# Patient Record
Sex: Male | Born: 1965 | Race: White | Hispanic: No | Marital: Single | Smoking: Current every day smoker
Health system: Southern US, Community
[De-identification: ages and names within clinical notes are randomized; demographics above are authoritative.]

## PROBLEM LIST (undated history)

## (undated) DIAGNOSIS — R0602 Shortness of breath: Secondary | ICD-10-CM

## (undated) DIAGNOSIS — I10 Essential (primary) hypertension: Secondary | ICD-10-CM

## (undated) DIAGNOSIS — F99 Mental disorder, not otherwise specified: Secondary | ICD-10-CM

---

## 2008-09-15 ENCOUNTER — Emergency Department (HOSPITAL_COMMUNITY): Admission: EM | Admit: 2008-09-15 | Discharge: 2008-09-15 | Payer: Self-pay | Admitting: Emergency Medicine

## 2012-01-14 ENCOUNTER — Emergency Department (HOSPITAL_COMMUNITY)
Admission: EM | Admit: 2012-01-14 | Discharge: 2012-01-19 | Disposition: A | Discharge status: Other Acute Inpatient Hospital | Payer: Self-pay | Attending: Emergency Medicine | Admitting: Emergency Medicine

## 2012-01-14 ENCOUNTER — Encounter (HOSPITAL_COMMUNITY): Payer: Self-pay | Admitting: Emergency Medicine

## 2012-01-14 DIAGNOSIS — Z9119 Patient's noncompliance with other medical treatment and regimen: Secondary | ICD-10-CM | POA: Insufficient documentation

## 2012-01-14 DIAGNOSIS — F329 Major depressive disorder, single episode, unspecified: Secondary | ICD-10-CM | POA: Insufficient documentation

## 2012-01-14 DIAGNOSIS — F29 Unspecified psychosis not due to a substance or known physiological condition: Secondary | ICD-10-CM | POA: Insufficient documentation

## 2012-01-14 DIAGNOSIS — I10 Essential (primary) hypertension: Secondary | ICD-10-CM | POA: Insufficient documentation

## 2012-01-14 DIAGNOSIS — Z8659 Personal history of other mental and behavioral disorders: Secondary | ICD-10-CM | POA: Insufficient documentation

## 2012-01-14 DIAGNOSIS — F3289 Other specified depressive episodes: Secondary | ICD-10-CM | POA: Insufficient documentation

## 2012-01-14 DIAGNOSIS — F17200 Nicotine dependence, unspecified, uncomplicated: Secondary | ICD-10-CM | POA: Insufficient documentation

## 2012-01-14 DIAGNOSIS — Z91199 Patient's noncompliance with other medical treatment and regimen due to unspecified reason: Secondary | ICD-10-CM | POA: Insufficient documentation

## 2012-01-14 LAB — CBC
HCT: 50.8 % (ref 39.0–52.0)
MCH: 31.3 pg (ref 26.0–34.0)
MCHC: 35 g/dL (ref 30.0–36.0)
MCV: 89.3 fL (ref 78.0–100.0)
Platelets: 332 10*3/uL (ref 150–400)
RDW: 12.6 % (ref 11.5–15.5)

## 2012-01-14 LAB — COMPREHENSIVE METABOLIC PANEL
Albumin: 4.2 g/dL (ref 3.5–5.2)
BUN: 7 mg/dL (ref 6–23)
Calcium: 10 mg/dL (ref 8.4–10.5)
Chloride: 100 mEq/L (ref 96–112)
Creatinine, Ser: 1 mg/dL (ref 0.50–1.35)
Total Bilirubin: 0.6 mg/dL (ref 0.3–1.2)
Total Protein: 8.2 g/dL (ref 6.0–8.3)

## 2012-01-14 LAB — ETHANOL: Alcohol, Ethyl (B): <11 mg/dL (ref 0–11)

## 2012-01-14 LAB — RAPID URINE DRUG SCREEN, HOSP PERFORMED
Amphetamines: NOT DETECTED
Cocaine: NOT DETECTED
Opiates: NOT DETECTED

## 2012-01-14 MED ORDER — HALOPERIDOL LACTATE 5 MG/ML IJ SOLN: 5.0000 mg | Freq: Four times a day (QID) | INTRAMUSCULAR | Status: DC | PRN

## 2012-01-14 MED ORDER — AMLODIPINE BESYLATE 10 MG PO TABS
10.0000 mg | ORAL_TABLET | Freq: Once | ORAL | Status: AC
  Administered 2012-01-14: 10 mg via ORAL
  Filled 2012-01-14 (×2): qty 1

## 2012-01-14 MED ORDER — BENZTROPINE MESYLATE 1 MG/ML IJ SOLN: 2.0000 mg | Freq: Two times a day (BID) | INTRAMUSCULAR | Status: DC | PRN

## 2012-01-14 MED ORDER — ACETAMINOPHEN 325 MG PO TABS: 650.0000 mg | ORAL_TABLET | ORAL | Status: DC | PRN

## 2012-01-14 MED ORDER — DIVALPROEX SODIUM 500 MG PO DR TAB
500.0000 mg | DELAYED_RELEASE_TABLET | Freq: Two times a day (BID) | ORAL | Status: DC
  Administered 2012-01-14 – 2012-01-18 (×9): 500 mg via ORAL
  Filled 2012-01-14 (×9): qty 1

## 2012-01-14 MED ORDER — SODIUM CHLORIDE 0.9 % IV BOLUS (SEPSIS)
1000.0000 mL | Freq: Once | INTRAVENOUS | Status: AC
  Administered 2012-01-14: 1000 mL via INTRAVENOUS

## 2012-01-14 MED ORDER — HALOPERIDOL 5 MG PO TABS
5.0000 mg | ORAL_TABLET | Freq: Two times a day (BID) | ORAL | Status: DC
  Administered 2012-01-14 – 2012-01-18 (×9): 5 mg via ORAL
  Filled 2012-01-14 (×9): qty 1

## 2012-01-14 MED ORDER — CLONAZEPAM 1 MG PO TABS
2.0000 mg | ORAL_TABLET | Freq: Two times a day (BID) | ORAL | Status: DC
  Administered 2012-01-14 – 2012-01-18 (×9): 2 mg via ORAL
  Filled 2012-01-14 (×2): qty 4
  Filled 2012-01-14 (×7): qty 2

## 2012-01-14 MED ORDER — LORAZEPAM 2 MG/ML IJ SOLN: 2.0000 mg | Freq: Four times a day (QID) | INTRAMUSCULAR | Status: DC | PRN

## 2012-01-14 MED ORDER — ONDANSETRON HCL 4 MG PO TABS: 4.0000 mg | ORAL_TABLET | Freq: Three times a day (TID) | ORAL | Status: DC | PRN

## 2012-01-14 MED ORDER — ALUM & MAG HYDROXIDE-SIMETH 200-200-20 MG/5ML PO SUSP: 30.0000 mL | ORAL | Status: DC | PRN

## 2012-01-14 MED ORDER — IRBESARTAN 150 MG PO TABS
150.0000 mg | ORAL_TABLET | Freq: Every day | ORAL | Status: DC
  Administered 2012-01-15 – 2012-01-18 (×4): 150 mg via ORAL
  Filled 2012-01-14 (×5): qty 1

## 2012-01-14 MED ORDER — ZOLPIDEM TARTRATE 5 MG PO TABS
5.0000 mg | ORAL_TABLET | Freq: Every evening | ORAL | Status: DC | PRN
  Filled 2012-01-14: qty 1

## 2012-01-14 MED ORDER — IBUPROFEN 600 MG PO TABS
600.0000 mg | ORAL_TABLET | Freq: Three times a day (TID) | ORAL | Status: DC | PRN
  Administered 2012-01-18: 600 mg via ORAL
  Filled 2012-01-14: qty 1

## 2012-01-14 MED ORDER — LORAZEPAM 1 MG PO TABS
1.0000 mg | ORAL_TABLET | Freq: Three times a day (TID) | ORAL | Status: DC | PRN
  Administered 2012-01-14 (×2): 1 mg via ORAL
  Filled 2012-01-14 (×2): qty 1

## 2012-01-14 MED ORDER — DIPHENHYDRAMINE HCL 50 MG/ML IJ SOLN: 50.0000 mg | Freq: Every evening | INTRAMUSCULAR | Status: DC | PRN

## 2012-01-14 MED ORDER — AMLODIPINE BESYLATE 5 MG PO TABS: 5.0000 mg | ORAL_TABLET | Freq: Every day | ORAL | Status: DC

## 2012-01-14 MED ORDER — CLONIDINE HCL 0.1 MG PO TABS
0.2000 mg | ORAL_TABLET | Freq: Once | ORAL | Status: AC
  Administered 2012-01-14: 0.1 mg via ORAL
  Filled 2012-01-14: qty 2

## 2012-01-14 NOTE — ED Notes (Signed)
Pt alert, arrives from home, presents via [employer], pt c/o depression, states has never been sen in [facility] for the same, pt has poor eye contact, staring into distance during conversation, pt appears frustrated to RN triage questioning, pt denies SI, per registration pt was arguing with significant other in [place]
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
## 2012-01-14 NOTE — ED Notes (Signed)
Patient took one pill out of the cup and stated that he will decide in 30 mins if he will take the other. Explained to patient that I could not hold the medication. Pt closed his eyes and rolled over in the bed.

## 2012-01-14 NOTE — ED Notes (Signed)
Pt's sister called with contact info given (H) [phone], (C) [phone].

## 2012-01-14 NOTE — ED Notes (Addendum)
Pt is getting dressed, Clinical research associate gave pt two gowns, no blue scrubs, and pt is bariatric

## 2012-01-14 NOTE — ED Notes (Signed)
Aldona Lento: Sister Home: [phone] Cell  :  [phone] Password: Lawrence Santiago

## 2012-01-14 NOTE — ED Notes (Signed)
Discussed elevated BP with Kathrine Cords. NP.   Patient can move to psych ED when BP lowers to acceptable range

## 2012-01-14 NOTE — ED Notes (Signed)
Pt getting oob stating he needs to use the bathroom. Staff attempted to redirect him to bathroom however he returns to bed and exposes himself stating that he is "trying to prove a point". Refusing to cover himself and seems to be getting more irritated. Consulting civil engineer and Security came to room to speak with pt. As soon as security is present, pt lowers gown and becomes quiet again. Will cont to monitor with sitter at bedside Campos-Garcia, Tresa Endo

## 2012-01-14 NOTE — ED Notes (Addendum)
Pt transferred to TCU with chart & 1 bag of personal belongings accompanied by Susa Griffins, NT, condition stable at time of transfer.

## 2012-01-14 NOTE — ED Notes (Addendum)
Pt got out of bed and slammed door, Security and [employer] present, will monitor. Pt continues to remove O2 sat, BP cuff and cardiac monitor.

## 2012-01-14 NOTE — ED Provider Notes (Signed)
History     CSN: [id number]  Arrival date & time 01/14/12  0030   First MD Initiated Contact with Patient 01/14/12 0247      Chief Complaint  Patient presents with  . Medical Clearance    (Consider location/radiation/quality/duration/timing/severity/associated sxs/prior treatment) HPI Comments: Patient has stopped caring about himself has not taken any medications in over a years to treat his HTN, DM or Bipolar disorder. He denies SI/HI stating  He just does not care.   The history is provided by the patient.    History reviewed. No pertinent past medical history.  History reviewed. No pertinent past surgical history.  No family history on file.  History  Substance Use Topics  . Smoking status: Not on file  . Smokeless tobacco: Not on file  . Alcohol Use: Not on file      Review of Systems  Constitutional: Negative for fever and chills.  HENT: Negative for congestion and rhinorrhea.   Respiratory: Negative for shortness of breath.   Cardiovascular: Negative for chest pain.  Gastrointestinal: Negative for nausea, abdominal pain and diarrhea.  Skin: Negative for rash and wound.  Neurological: Negative for dizziness, weakness and headaches.    Allergies  Review of patient's allergies indicates no known allergies.  Home Medications   Current Outpatient Rx  Name  Route  Sig  Dispense  Refill  . BC FAST PAIN RELIEF ARTHRITIS PO   Oral   Take by mouth.         Doreatha Martin COLD & FLU PO   Oral   Take by mouth.           BP 190/127  Pulse 125  Temp 98.6 F (37 C) (Oral)  Resp 22  SpO2 96%  Physical Exam  Constitutional: He appears well-developed and well-nourished.  Eyes: Pupils are equal, round, and reactive to light.  Neck: Normal range of motion.  Cardiovascular: Normal rate.   Pulmonary/Chest: Effort normal.  Abdominal: Soft.  Genitourinary: Penis normal.  Neurological: He is alert.  Skin: Skin is warm.  Psychiatric: His behavior is normal.  Judgment and thought content normal. His speech is delayed. Cognition and memory are normal. He exhibits a depressed mood. He expresses no homicidal and no suicidal ideation. He expresses no suicidal plans and no homicidal plans.    ED Course  Procedures (including critical care time)  Labs Reviewed  CBC - Abnormal; Notable for the following:    WBC 10.9 (*)     Hemoglobin 17.8 (*)     All other components within normal limits  COMPREHENSIVE METABOLIC PANEL - Abnormal; Notable for the following:    Glucose, Bld 185 (*)     GFR calc non Af Amer 89 (*)     All other components within normal limits  ETHANOL  URINE RAPID DRUG SCREEN (HOSP PERFORMED)   No results found.   No diagnosis found.    MDM          Arman Filter, NP 01/16/12 0009

## 2012-01-14 NOTE — Progress Notes (Signed)
CSW met with pt to attempt to complete assessment. Pt was calm and polite, however requested to speak with CSW at later time. Pt stated, "later, I just want to chill right now." CSW and pt agreed to talk in an hour.   Catha Gosselin, LCSWA  [phone] .01/14/2012 1658pm

## 2012-01-14 NOTE — ED Notes (Signed)
This nurse called act team, spoke with Aurther Loft whom reported that they were told that pt was going to be DC home so act consult has not been completed, this nurse informed them that pt not being DC home and still needs consult.

## 2012-01-14 NOTE — ED Provider Notes (Signed)
Pt is hypertensive and tachycardic, has been without his meds for a year. Medical screen.  Once VS stable pt can go to psych for further evaluation.  Denies SI/HI, or hallucination.  I have attempt to communicate with patient to ask if there's anything in specific that we can offer him but pt does not want to talk . He however appears comfortable, BP has improved after Norvasc.  Heart with S1S2 no M/R/G, Lung with decreased breath sounds bout without wheezes or rales, abd non tender.  Pt is morbidly obese.    Will consult with ACT team for further management.    BP 165/111  Pulse 104  Temp 98.6 F (37 C) (Oral)  Resp 20  SpO2 93%  Pt will be placed on ibersartan 150mg  daily starting tomorrow.  This is the substitute dose of benicar 20mg  that pt has listed in his medication from prior visit in 2010.    8:15 AM i have consulted with ACT, who agrees to evaluate pt and continue further management.  My attending is aware.  Pt currently in NAD.    Results for orders placed during the hospital encounter of 01/14/12  CBC      Component Value Range   WBC 10.9 (*) 4.0 - 10.5 K/uL   RBC 5.69  4.22 - 5.81 MIL/uL   Hemoglobin 17.8 (*) 13.0 - 17.0 g/dL   HCT 16.1  09.6 - 04.5 %   MCV 89.3  78.0 - 100.0 fL   MCH 31.3  26.0 - 34.0 pg   MCHC 35.0  30.0 - 36.0 g/dL   RDW 40.9  81.1 - 91.4 %   Platelets 332  150 - 400 K/uL  COMPREHENSIVE METABOLIC PANEL      Component Value Range   Sodium 137  135 - 145 mEq/L   Potassium 3.6  3.5 - 5.1 mEq/L   Chloride 100  96 - 112 mEq/L   CO2 22  19 - 32 mEq/L   Glucose, Bld 185 (*) 70 - 99 mg/dL   BUN 7  6 - 23 mg/dL   Creatinine, Ser 7.82  0.50 - 1.35 mg/dL   Calcium 95.6  8.4 - 21.3 mg/dL   Total Protein 8.2  6.0 - 8.3 g/dL   Albumin 4.2  3.5 - 5.2 g/dL   AST 20  0 - 37 U/L   ALT 23  0 - 53 U/L   Alkaline Phosphatase 91  39 - 117 U/L   Total Bilirubin 0.6  0.3 - 1.2 mg/dL   GFR calc non Af Amer 89 (*) >90 mL/min   GFR calc Af Amer >90  >90 mL/min    ETHANOL      Component Value Range   Alcohol, Ethyl (B) <11  0 - 11 mg/dL  URINE RAPID DRUG SCREEN (HOSP PERFORMED)      Component Value Range   Opiates NONE DETECTED  NONE DETECTED   Cocaine NONE DETECTED  NONE DETECTED   Benzodiazepines NONE DETECTED  NONE DETECTED   Amphetamines NONE DETECTED  NONE DETECTED   Tetrahydrocannabinol NONE DETECTED  NONE DETECTED   Barbiturates NONE DETECTED  NONE DETECTED    Date: 01/14/2012  Rate: 103  Rhythm: sinus tachycardia  QRS Axis: normal  Intervals: normal  ST/T Wave abnormalities: nonspecific ST/T changes  Conduction Disutrbances:none  Narrative Interpretation:   Old EKG Reviewed: unchanged   11:41 AM Pt's BP fluctuates, will give clonidine 0.2mg .  Telepsych ordered. Discussed with my attending  3:53 PM Pt  was evaluated by psychiatrist via telepsych.    Recommendation: Pt is currently not psychiatrically stable. Pt needs psychiatr inpatient hospitalization. Continue IVC papers Re-consult as needed  Medication includes: Haldol, depakote, clonazepam.  Along with PRN order of Haldol, ativan, cogentin, benadryl.  Hold meds if pt too sedated or if low Blood Pressure and Pulse If pt receive multiple doses of Haldol-EKG recommended to monitor for QTC prolongation.    4-labs: Depakote level 5 days after initial dose.  My attending is aware.  BP 172/80  Pulse 101  Temp 97.6 F (36.4 C) (Oral)  Resp 17  SpO2 93%   Fayrene Helper, PA-C 01/14/12 0904  Fayrene Helper, PA-C 01/14/12 1557
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
## 2012-01-14 NOTE — ED Notes (Signed)
Sister present and in to see patient. Pt told sister get out of his room, no one loved him and he wanted to kill himself. Sister states that pt has an extensive hx of mental illness. States that she does not know how long the pt has not had his meds. Gave PCP Dr. Charm Barges at [facility] [phone] for med list information. Dr. Shyrl Numbers aware of telepysch MD recommendation Dr Trisha Mangle to continue IVC. Informed Dr. Shyrl Numbers of pt refusing medications and not talking with staff. Ok'd for IVC papers to be initiated. Faxed received of current medications. Attempting to contact pharmacy to add medications

## 2012-01-14 NOTE — Progress Notes (Signed)
ACT assessment counselor in with patient completing assessment.   Catha Gosselin, LCSWA  [phone] .01/14/2012 1800pm

## 2012-01-14 NOTE — ED Notes (Signed)
Ambulated in room to restroom without difficulty. Continues not to talk with staff.

## 2012-01-14 NOTE — ED Notes (Signed)
Telepsych complete. Recommendations to [facility] and Dr Higinio Plan

## 2012-01-14 NOTE — ED Notes (Signed)
ACT team was able to talk with the patient. She will finish his assessment

## 2012-01-14 NOTE — Progress Notes (Signed)
47 year old male in [place] TCU on 01/14/12 for depression IVC by family- Family reports hx bipolar, "mental issues" and not having medications for about a year. Chart review 2010 information shows htn, panic attack and bipolar for pmh. Pt noted to be talking to himself in his room but refused to speak with ED and telepsych staff during evaluations. Pt with elevations in bp and HR. Drug screen negative Hgb 17.8 wbc 10.9 BP improved with Norvasc po, ns bolus, ativan po & catapres po ordered by EDP. To be started on depakote po, ativan po, catapres po and avapro po with Depakote level check needed 5 days from 01/13/12

## 2012-01-14 NOTE — ED Notes (Signed)
[place] PA notified of medication complication

## 2012-01-14 NOTE — Progress Notes (Signed)
Noted pt's pcp in TCU RN noted EPIC updated

## 2012-01-14 NOTE — ED Notes (Signed)
Pt is talking out loud. No one is in his room. Periods of aggitation.

## 2012-01-14 NOTE — ED Notes (Signed)
IVC papers faxed 

## 2012-01-14 NOTE — ED Notes (Signed)
Telepsych request faxed.

## 2012-01-14 NOTE — ED Notes (Signed)
MD at bedside. 

## 2012-01-14 NOTE — Progress Notes (Signed)
Telepsych recommendations include medication management, IVC and inpatient [facility] admission disposition

## 2012-01-15 NOTE — ED Notes (Signed)
Pt  Alert x4 v/s stable, no c/o pain. Report given and Pt transferred to Psy Ed will continue to monitor.

## 2012-01-15 NOTE — ED Notes (Signed)
Report received, airway intact, no s/s's of distress-resting quietly-sitter at bedside-will continue to monitor

## 2012-01-15 NOTE — ED Notes (Signed)
Took medication with some couching-needs to be approached lightly, soft tones-behavior unpredictable-remains guarded-will continue to monitor
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
## 2012-01-16 NOTE — ED Notes (Signed)
Patient is resting comfortably. 

## 2012-01-16 NOTE — ED Provider Notes (Signed)
Filed Vitals:   01/16/12 0640  BP: 143/89  Pulse:   Temp:   Resp:   BP still ok.  Telepsych on 1/3 recommends inpatient and pt remains involuntary.  No beds.  In review of case manager notes, Depakote level needs to be drawn in 5 days from 1/2, so should be ordered in AM of 1/7.  Notes from case manager on 1/3 indicate likely [facility] admission.  Gavin Pound. Oletta Lamas, MD 01/16/12 [phone]

## 2012-01-16 NOTE — ED Notes (Signed)
Referral faxed to [facility] for review.   Janann Colonel., MSW, [facility] Weekend ED Clinical Social Worker [phone]

## 2012-01-16 NOTE — ED Notes (Signed)
Sleeping

## 2012-01-16 NOTE — ED Notes (Signed)
Pt mother called to check on pt status.

## 2012-01-16 NOTE — ED Notes (Signed)
Reports hearing nice voices, declines the need to have medication.

## 2012-01-16 NOTE — ED Provider Notes (Signed)
Medical screening examination/treatment/procedure(s) were performed by non-physician practitioner and as supervising physician I was immediately available for consultation/collaboration.  Jones Skene, M.D.     Jones Skene, MD 01/16/12 1128

## 2012-01-17 NOTE — [facility] Counselor (Signed)
Pt arrived to [facility] 01/14/2011 with psychotic symptoms present. Patient last evaluated by psychiatrist with telepyschiatry (01/14/11). However at that time patient refused to speak with the psychiatrist at that time. The psychiatrist recommended inpatient hospitalization.   Today, this Clinical research associate spoke to EDP-Dr. Adriana Simas regarding patients disposition and patients reluctance to speak with the psychiatrist 01/14/2011. Patient is more talkative today. This patient will be re-telepsyched today to determine further dis positioning as he is more cooperative today and agrees to speak to the psychiatrist.

## 2012-01-17 NOTE — Progress Notes (Signed)
Pt has been in [facility] ED psych  Unit since 01/14/12 -hearing voices, Refused to speak with telepsych MD on 01/14/12 Pt continues to receive Haldol po, avapro po, depakote po and kloponin po per ED clinical encounter. Pending [facility] or [facility]

## 2012-01-17 NOTE — Progress Notes (Signed)
Stated willing to speak with telepsych MD on 01/17/12 Clarification: pt noted to refuse medications at times

## 2012-01-17 NOTE — BHH Counselor (Signed)
01/17/12 Pt completed another telepsych and recommendations note that patient still needs inpatient treatment.

## 2012-01-17 NOTE — Clinical Social Work Note (Addendum)
01/18/2012 10:11am Pt declined at [facility] no high acuity beds available.  Pt will need higher level of care than [facility] can provide.    Referred pt on 01/05 to [facility] for review.  Vickii Penna, LCSWA [phone]  Clinical Social Work

## 2012-01-17 NOTE — ED Provider Notes (Signed)
Medical screening examination/treatment/procedure(s) were performed by non-physician practitioner and as supervising physician I was immediately available for consultation/collaboration.   Hanley Seamen, MD 01/17/12 [phone]

## 2012-01-18 NOTE — [facility] Counselor (Signed)
Pt accepted pending an avail bed-per Donell Sievert *Pt will go to [phone] once bed becomes available.

## 2012-01-18 NOTE — ED Provider Notes (Signed)
Ross Watts is a 47 y.o. male with chronic psychiatric disease, who has hearing voices. He, states he does not have a psychiatrist. He is awaiting placement in a psychiatric facility. He expresses no needs currently. He has been seen by [facility], who recommends admission.  Flint Melter, MD 01/18/12 [phone]

## 2012-01-18 NOTE — ED Notes (Signed)
Patient on telephone with mother briefly.

## 2012-01-19 ENCOUNTER — Encounter (HOSPITAL_COMMUNITY): Payer: Self-pay

## 2012-01-19 ENCOUNTER — Inpatient Hospital Stay (HOSPITAL_COMMUNITY)
Admission: EM | Admit: 2012-01-19 | Discharge: 2012-01-24 | DRG: 885 | Disposition: A | Discharge status: Home / Self Care | Payer: Federal, State, Local not specified - Other | Source: Ambulatory Visit | Attending: Psychiatry | Admitting: Psychiatry

## 2012-01-19 DIAGNOSIS — Z23 Encounter for immunization: Secondary | ICD-10-CM

## 2012-01-19 DIAGNOSIS — F3189 Other bipolar disorder: Principal | ICD-10-CM | POA: Diagnosis present

## 2012-01-19 DIAGNOSIS — I10 Essential (primary) hypertension: Secondary | ICD-10-CM | POA: Diagnosis present

## 2012-01-19 DIAGNOSIS — Z9119 Patient's noncompliance with other medical treatment and regimen: Secondary | ICD-10-CM

## 2012-01-19 DIAGNOSIS — F3181 Bipolar II disorder: Secondary | ICD-10-CM | POA: Diagnosis present

## 2012-01-19 DIAGNOSIS — Z91199 Patient's noncompliance with other medical treatment and regimen due to unspecified reason: Secondary | ICD-10-CM

## 2012-01-19 DIAGNOSIS — Z79899 Other long term (current) drug therapy: Secondary | ICD-10-CM

## 2012-01-19 HISTORY — DX: Shortness of breath: R06.02

## 2012-01-19 HISTORY — DX: Mental disorder, not otherwise specified: F99

## 2012-01-19 HISTORY — DX: Essential (primary) hypertension: I10

## 2012-01-19 LAB — TSH: TSH: 1.971 u[IU]/mL (ref 0.350–4.500)

## 2012-01-19 MED ORDER — ZIPRASIDONE HCL 60 MG PO CAPS
60.0000 mg | ORAL_CAPSULE | Freq: Every day | ORAL | Status: DC
  Administered 2012-01-19: 60 mg via ORAL
  Filled 2012-01-19 (×3): qty 1

## 2012-01-19 MED ORDER — OLMESARTAN MEDOXOMIL-HCTZ 20-12.5 MG PO TABS: 1.0000 | ORAL_TABLET | Freq: Every day | ORAL | Status: DC

## 2012-01-19 MED ORDER — PAROXETINE HCL 20 MG PO TABS
20.0000 mg | ORAL_TABLET | Freq: Every day | ORAL | Status: DC
  Administered 2012-01-19 – 2012-01-24 (×6): 20 mg via ORAL
  Filled 2012-01-19 (×7): qty 1

## 2012-01-19 MED ORDER — HYDROCHLOROTHIAZIDE 12.5 MG PO CAPS
12.5000 mg | ORAL_CAPSULE | Freq: Every day | ORAL | Status: DC
  Administered 2012-01-19 – 2012-01-24 (×6): 12.5 mg via ORAL
  Filled 2012-01-19 (×8): qty 1

## 2012-01-19 MED ORDER — TEMAZEPAM 7.5 MG PO CAPS
7.5000 mg | ORAL_CAPSULE | Freq: Every day | ORAL | Status: DC
  Administered 2012-01-19: 7.5 mg via ORAL
  Filled 2012-01-19: qty 1

## 2012-01-19 MED ORDER — NICOTINE 21 MG/24HR TD PT24
21.0000 mg | MEDICATED_PATCH | Freq: Every day | TRANSDERMAL | Status: DC
  Administered 2012-01-19 – 2012-01-24 (×6): 21 mg via TRANSDERMAL
  Filled 2012-01-19 (×8): qty 1

## 2012-01-19 MED ORDER — ALUM & MAG HYDROXIDE-SIMETH 200-200-20 MG/5ML PO SUSP: 30.0000 mL | ORAL | Status: DC | PRN

## 2012-01-19 MED ORDER — IRBESARTAN 150 MG PO TABS
150.0000 mg | ORAL_TABLET | Freq: Every day | ORAL | Status: DC
  Administered 2012-01-19 – 2012-01-24 (×6): 150 mg via ORAL
  Filled 2012-01-19 (×8): qty 1

## 2012-01-19 MED ORDER — ACETAMINOPHEN 325 MG PO TABS
650.0000 mg | ORAL_TABLET | Freq: Four times a day (QID) | ORAL | Status: DC | PRN
  Administered 2012-01-19 – 2012-01-24 (×7): 650 mg via ORAL

## 2012-01-19 MED ORDER — CLONIDINE HCL 0.1 MG/24HR TD PTWK
0.1000 mg | MEDICATED_PATCH | TRANSDERMAL | Status: DC
  Administered 2012-01-19: 0.1 mg via TRANSDERMAL
  Filled 2012-01-19 (×2): qty 1

## 2012-01-19 MED ORDER — PNEUMOCOCCAL VAC POLYVALENT 25 MCG/0.5ML IJ INJ
0.5000 mL | INJECTION | INTRAMUSCULAR | Status: AC
  Administered 2012-01-20: 0.5 mL via INTRAMUSCULAR

## 2012-01-19 MED ORDER — MAGNESIUM HYDROXIDE 400 MG/5ML PO SUSP: 30.0000 mL | Freq: Every day | ORAL | Status: DC | PRN

## 2012-01-19 NOTE — Progress Notes (Signed)
D:Pt rates depression as a 5 on 1-10 scale with 10 being the most depressed. He currently denies hallucinations. Pt has passive si. 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 Blood pressure elevated this morning. A:Supported pt to discuss feelings. Gave medication as ordered and 15 minute checks. Gave prn medication for wrist pain. Monitoring blood pressure and reportied to NP. R:Pt taking medications and is cooperative on the unit. He contracts with staff for safety.

## 2012-01-19 NOTE — Progress Notes (Signed)
D:Pt denied voices this morning and later reported that he hears a hissing sound. Pt started having thought blocking and became tearful when talking about financial and domestic stressors.  Pt oriented to time and place.  A:Encouraged pt to express feelings. MD present when writer talking with pt. R:Safety maintained on the unit.

## 2012-01-19 NOTE — Tx Team (Signed)
Initial Interdisciplinary Treatment Plan  PATIENT STRENGTHS: (choose at least two) Ability for insight Supportive family/friends  PATIENT STRESSORS: Financial difficulties Health problems Medication change or noncompliance   PROBLEM LIST: Problem List/Patient Goals Date to be addressed Date deferred Reason deferred Estimated date of resolution  Depression      Suicide                                                 DISCHARGE CRITERIA:  Ability to meet basic life and health needs Improved stabilization in mood, thinking, and/or behavior Medical problems require only outpatient monitoring  PRELIMINARY DISCHARGE PLAN: Attend aftercare/continuing care group Attend PHP/IOP  PATIENT/FAMIILY INVOLVEMENT: This treatment plan has been presented to and reviewed with the patient, Ross Watts.  The patient and family have been given the opportunity to ask questions and make suggestions.  Jacques Navy A 01/19/2012, 3:37 AM

## 2012-01-19 NOTE — Clinical Social Work Note (Signed)
[facility] Group Therapy  01/19/2012 , 1:27 PM   Type of Therapy:  [facility] [employer] Presentation  Did not attend  Ross Watts B 01/19/2012 , 1:27 PM   

## 2012-01-19 NOTE — Treatment Plan (Signed)
Interdisciplinary Treatment Plan Update (Adult)  Date: 01/19/2012  Time Reviewed: 8:14 AM   Progress in Treatment: Attending groups: Yes Participating in groups: Yes Taking medication as prescribed: Yes Tolerating medication: Yes   Family/Significant other contact made:  No Patient understands diagnosis:  Yes  As evidenced by asking for help with racing thoughts, depression Discussing patient identified problems/goals with staff:  Yes  See below Medical problems stabilized or resolved:  Yes Denies suicidal/homicidal ideation: No  On self inventory states has SI "on and off" Issues/concerns per patient self-inventory:  Yes  Sleep is fair,  Depression is 5, hopelessness is 3  C/O dizziness Other:  New problem(s) identified: N/A  Reason for Continuation of Hospitalization: Anxiety Depression Medication stabilization  Interventions implemented related to continuation of hospitalization: Medication trial-will make sure Hermes is on meds that appear on $4. Formulary as he is not able to afford others-which leads to non-compliance  Additional comments:  Estimated length of stay: 4-5 days  Discharge Plan:return home, follow up outpt  New goal(s): N/A  Review of initial/current patient goals per problem list:   1.  Goal(s): Eliminate SI  Met:  No  Target date:1/13  As evidenced [id number] report  2.  Goal (s): Stabilize mood through Ross use of medications/therapeutic milieu  Met:  No  Target date:1/13  As evidenced [id number] will rate his depression and anxiety at a 3 or less, and his racing thoughts will be eliminated  3.  Goal(s): Identify outpt provider  Met:  No  Target date:1/13  As evidenced by: confirmation of follow up appointment time and date  4.  Goal(s):  Met:  No  Target date:  As evidenced by:  Attendees: Patient:     Family:     Physician:  Thedore Watts 01/19/2012 8:14 AM   Nursing:  Waynetta Sandy  01/19/2012 8:14 AM   Clinical Social Worker:  Richelle Ito 01/19/2012 8:14 AM   Extender:  Verne Spurr PA 01/19/2012 8:14 AM   Other:     Other:     Other:     Other:      Scribe for Treatment Team:   Ida Rogue, 01/19/2012 8:14 AM

## 2012-01-19 NOTE — Progress Notes (Signed)
D: Pt did not go to the gym for recreation.Pt denies any AVH @ this time.Affect is blunted & mood sad & depressed.BP was elevated @ 1700.BP re checked manually @ 1715 = 164/90 sitting & 150/100 standing.A: Pt was restarted on BP medication this morning. Continues on 15 minute checks.Supported & encouraged.R: Contracts for safety on the unit.pt safety maintained.

## 2012-01-19 NOTE — Progress Notes (Signed)
Psychoeducational Group Note  Date:  01/19/2012 Time:  1100  Group Topic/Focus:  Personal Choices and Values:   The focus of this group is to help patients assess and explore the importance of values in their lives, how their values affect their decisions, how they express their values and what opposes their expression.  Participation Level:  Minimal  Participation Quality:  Appropriate and Attentive  Affect:  Appropriate  Cognitive:  Appropriate  Insight:  Improving  Engagement in Group:  Lacking  Additional Comments:   Ross Watts attended and participated during choices and values group. Patient stated what were some negative and positive values and choices that have been made within his life span. Patient was appropriate and shared when asked to speak.  [name] 01/19/2012, 11:53 AM

## 2012-01-19 NOTE — Progress Notes (Signed)
Patient ID: Ross Watts, male   DOB: [birth year], 47 y.o.   MRN: [id number]  Admission Note:   D:46 yr male who presents IVC in no acute distress for the treatment of SI and Depression. Pt appears flat and depressed. Pt was calm and cooperative with admission process. Pt presents with passive AH and contracts for safety upon admission. Pt denies current SI at this time .  Pt has Past medical Hx of HTN, Depression, and DM. Pt Bp was 165/100 manually... PA notified   A: Skin was assessed and found to be clear of any abnormal marks. POC and unit policies explained and understanding verbalized. Consents obtained. Food and fluids offered, and  accepted. Pt had no  additional questions or concerns at this time. Q15 min checks for safety started. Support and encouragement provided.   R: Patient remains safe.  Safety has been maintained , continue current POC.
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
## 2012-01-19 NOTE — Progress Notes (Signed)
Psychoeducational Group Note  Date:  01/19/2012 Time:  8:15PM  Group Topic/Focus:  Wrap-Up Group:   The focus of this group is to help patients review their daily goal of treatment and discuss progress on daily workbooks.  Participation Level:  Active  Participation Quality:  Appropriate  Affect:  Appropriate  Cognitive:  Alert and Oriented  Insight:  Developing/Improving  Engagement in Group:  Developing/Improving  Additional Comments:  Pt stated that he wants to learn how things run around the facility  So that he doesn't feel as scared. Pt stated that he wants to work on not being so nervous. Pt stated that he can work on not being so nervous by relaxing.  Audery Wassenaar, Randal Buba 01/19/2012, 9:13 PM
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
## 2012-01-19 NOTE — Progress Notes (Signed)
[facility] LCSW Aftercare Discharge Planning Group Note  01/19/2012 10:05 AM  Participation Quality:  Attentive  Affect:  Appropriate  Cognitive:  Alert  Insight:  Engaged  Engagement in Group:  Engaged  Modes of Intervention:  Discussion, Exploration and Socialization  Summary of Progress/Problems: Ross Watts states it was his co-workers idea for him to come in.  They were concerned about his behavior, but he could not say what exactly they were concerned about.  States he is overwhelmed with financial and academic stressors.  Confirmed he has been at [facility] in 72.  States his partner and family are supports.  Ross Watts 01/19/2012, 10:05 AM

## 2012-01-20 MED ORDER — FLUPHENAZINE HCL 5 MG PO TABS
5.0000 mg | ORAL_TABLET | Freq: Two times a day (BID) | ORAL | Status: DC
  Administered 2012-01-20 – 2012-01-24 (×8): 5 mg via ORAL
  Filled 2012-01-20 (×10): qty 1

## 2012-01-20 MED ORDER — TRAZODONE HCL 50 MG PO TABS
50.0000 mg | ORAL_TABLET | Freq: Every day | ORAL | Status: DC
  Administered 2012-01-21 – 2012-01-23 (×3): 50 mg via ORAL
  Filled 2012-01-20 (×5): qty 1

## 2012-01-20 MED ORDER — BENZTROPINE MESYLATE 0.5 MG PO TABS
0.5000 mg | ORAL_TABLET | Freq: Two times a day (BID) | ORAL | Status: DC
  Administered 2012-01-20 – 2012-01-24 (×8): 0.5 mg via ORAL
  Filled 2012-01-20 (×10): qty 1

## 2012-01-20 MED ORDER — FLUPHENAZINE HCL 2.5 MG PO TABS: 2.5000 mg | ORAL_TABLET | Freq: Two times a day (BID) | ORAL | Status: DC

## 2012-01-20 NOTE — Progress Notes (Signed)
Patient ID: Ross Watts, male   DOB: Jun 03, 1965, 47 y.o.   MRN: [id number] D: No new data from previous assessment. Patient in bed sleeping. Respiration regular and unlabored. No sign of distress noted at this time A: 15 mins checks for  Safety. R: Patient remains asleep. Pt is safe.

## 2012-01-20 NOTE — Progress Notes (Signed)
Patient resting quietly with eyes closed; respirations even and unlabored. No distress noted. Q 15 minute check continues as ordered to maintain safety.  

## 2012-01-20 NOTE — Progress Notes (Signed)
Patient ID: Ross Watts, male   DOB: 1965-06-02, 47 y.o.   MRN: [id number] Patient continues to improve; he states that he is still having mood swings.  His states that his sleep has improved.  He feels he is getting too much geodon; the MD will decrease it today.  Patient has an excellent support system.  He denies any SI/HI/AVH.  He is attending groups and is participating in his treatment.  Continue to monitor medication management, MD orders and 15 safety checks per protocol.  Collaborate with treatment team regarding patient's POC.  Patient's behavior has been appropriate; encourage and support patient as needed.

## 2012-01-20 NOTE — Progress Notes (Signed)
Psychoeducational Group Note  Date:  01/20/2012 Time:  0930  Group Topic/Focus:  Building Self Esteem:   The Focus of this group is helping patients become aware of the effects of self-esteem on their lives, the things they and others do that enhance or undermine their self-esteem, seeing the relationship between their level of self-esteem and the choices they make and learning ways to enhance self-esteem.  Participation Level:  Active  Participation Quality:  Appropriate, Attentive and Sharing  Affect:  Appropriate  Cognitive:  Appropriate  Insight:  Engaged  Engagement in Group:  Engaged  Additional Comments:  Ross Watts was appropriate and attended group. Patient stated one thing he like about himself and how he could improve his self esteem. During group it was discussed what low and self esteem was and how patient defined self-esteem in own terms.   [name] 01/20/2012, 10:51 AM
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
## 2012-01-20 NOTE — Progress Notes (Signed)
[facility] MD Progress Note  01/20/2012 10:46 AM Ross Watts  MRN:  [id number]  Subjective: He reports ongoing mood swings,depression, racing thoughts, poor concentration, low energy level, feeling foogy and lack of motivation . Patient reports feeling helpless,  having crying episodes and difficulty sleeping.  Diagnosis:  Bipolar 2 disorder   ADL's:  Intact  Sleep: Poor  Appetite:  Fair  Suicidal Ideation:  Plan:  denies Intent:  denies Means:  denies Homicidal Ideation:  Plan:  denies Intent:  denies Means:  denies AEB (as evidenced by):  Psychiatric Specialty Exam: Review of Systems  Constitutional: Negative.   HENT: Negative.   Eyes: Negative.   Respiratory: Negative.   Cardiovascular: Negative.   Gastrointestinal: Negative.   Genitourinary: Negative.   Musculoskeletal: Negative.   Skin: Negative.   Neurological: Negative.   Endo/Heme/Allergies: Negative.   Psychiatric/Behavioral: The patient is nervous/anxious and has insomnia.     Blood pressure 138/94, pulse 96, temperature 97.6 F (36.4 C), temperature source Oral, resp. rate 20, height 5\' 6"  (1.676 m), weight 141.976 kg (313 lb).Body mass index is 50.52 kg/(m^2).  General Appearance: Disheveled  Eye Contact::  Minimal  Speech:  Garbled and Slow  Volume:  Decreased  Mood:  Depressed and Dysphoric  Affect:  Constricted  Thought Process:  blocking  Orientation:  Full (Time, Place, and Person)  Thought Content:  Rumination  Suicidal Thoughts:  No  Homicidal Thoughts:  No  Memory:  Immediate;   Fair Recent;   Fair Remote;   Fair  Judgement:  Impaired  Insight:  Lacking  Psychomotor Activity:  Psychomotor Retardation  Concentration:  Poor  Recall:  Poor  Akathisia:  No  Handed:  Ambidextrous  AIMS (if indicated):     Assets:  Desire for Improvement  Sleep:  Number of Hours: 6.75    Current Medications: Current Facility-Administered Medications  Medication Dose Route Frequency Provider Last Rate Last  Dose  . acetaminophen (TYLENOL) tablet 650 mg  650 mg Oral Q6H PRN Kerry Hough, PA   650 mg at 01/19/12 7846  . alum & mag hydroxide-simeth (MAALOX/MYLANTA) 200-200-20 MG/5ML suspension 30 mL  30 mL Oral Q4H PRN Kerry Hough, PA      . cloNIDine (CATAPRES - Dosed in mg/24 hr) patch 0.1 mg  0.1 mg Transdermal Weekly Kerry Hough, PA   0.1 mg at 01/19/12 2249  . fluPHENAZine (PROLIXIN) tablet 2.5 mg  2.5 mg Oral BID PC Nakeisha Greenhouse      . hydrochlorothiazide (MICROZIDE) capsule 12.5 mg  12.5 mg Oral Daily Malak Duchesneau   12.5 mg at 01/20/12 0752   And  . irbesartan (AVAPRO) tablet 150 mg  150 mg Oral Daily Boyd Buffalo   150 mg at 01/20/12 0752  . magnesium hydroxide (MILK OF MAGNESIA) suspension 30 mL  30 mL Oral Daily PRN Kerry Hough, PA      . nicotine (NICODERM CQ - dosed in mg/24 hours) patch 21 mg  21 mg Transdermal Q0600 Kerry Hough, PA   21 mg at 01/20/12 9629  . PARoxetine (PAXIL) tablet 20 mg  20 mg Oral Daily Boluwatife Flight   20 mg at 01/20/12 0752  . pneumococcal 23 valent vaccine (PNU-IMMUNE) injection 0.5 mL  0.5 mL Intramuscular Tomorrow-1000 Akiba Melfi      . traZODone (DESYREL) tablet 50 mg  50 mg Oral QHS Malayia Spizzirri        Lab Results:  Results for orders placed during the hospital encounter of  01/19/12 (from the past 48 hour(s))  GLUCOSE, CAPILLARY     Status: Normal   Collection Time   01/19/12  6:16 AM      Component Value Range Comment   Glucose-Capillary 84  70 - 99 mg/dL   TSH     Status: Normal   Collection Time   01/19/12  6:32 AM      Component Value Range Comment   TSH 1.971  0.350 - 4.500 uIU/mL   GLUCOSE, CAPILLARY     Status: Abnormal   Collection Time   01/20/12  5:55 AM      Component Value Range Comment   Glucose-Capillary 101 (*) 70 - 99 mg/dL     Physical Findings: AIMS: Facial and Oral Movements Muscles of Facial Expression: None, normal Lips and Perioral Area: None, normal Jaw: None, normal Tongue: None,  normal,Extremity Movements Upper (arms, wrists, hands, fingers): None, normal Lower (legs, knees, ankles, toes): None, normal, Trunk Movements Neck, shoulders, hips: None, normal, Overall Severity Severity of abnormal movements (highest score from questions above): None, normal Incapacitation due to abnormal movements: None, normal Patient's awareness of abnormal movements (rate only patient's report): No Awareness, Dental Status Current problems with teeth and/or dentures?: Yes Does patient usually wear dentures?: No  CIWA:  CIWA-Ar Total: 0  COWS:     Treatment Plan Summary: Daily contact with patient to assess and evaluate symptoms and progress in treatment Medication management  Plan: 1.Discontinue Geodon, patient complain of feeling foggy on it. Initiate Prolixin 5mg  po twice daily for mood/psychosis 2. Initiate Cogentin 0.5mg  BID for EPS prevention 3. Continue other medication regimen. 4. Patient encouraged to attend group and other unit milieu.  Medical Decision Making Problem Points:  Review of last therapy session (1) and Review of psycho-social stressors (1) Data Points:  Order Aims Assessment (2) Review of medication regiment & side effects (2) Review of new medications or change in dosage (2)  I certify that inpatient services furnished can reasonably be expected to improve the patient's condition.   Wilburt Messina,MD 01/20/2012, 10:46 AM

## 2012-01-20 NOTE — Progress Notes (Signed)
Patient ID: Ross Watts, male   DOB: 1965/03/17, 47 y.o.   MRN: [id number]  D: Pt denies SI/HI/AVH. Pt is pleasant and cooperative. Pt complained about generalized muscle pain/ spasms. Pt worried about where he will be going when he leaves here, says he wants to go back to the same place, but financially that may not be possible.  A: Pt was offered support and encouragement. Pt was given scheduled medications. Pt was encourage to attend groups. Q 15 minute checks were done for safety. Pt given tylenol for muscle/spasm pain at 2014   R: Pt is taking medication.Pt receptive to treatment and safety maintained on unit. Pt sleep upon re-assessment of pain.

## 2012-01-20 NOTE — Progress Notes (Signed)
[facility] LCSW Aftercare Discharge Planning Group Note  01/20/2012 3:51 PM  Participation Quality:  Attentive  Affect:  Blunted  Cognitive:  Alert  Insight:  Improving  Engagement in Group:  Limited  Modes of Intervention:  Discussion, Exploration and Socialization  Summary of Progress/Problems:Frankie was a bit brighter this AM; stated he slept well last night.  We talked about making sure he is getting meds on the $4.00 formulary.  Confirmed he will be going to stay with family in [place] post d/c.   Ross Watts 01/20/2012, 3:51 PM

## 2012-01-21 NOTE — Progress Notes (Signed)
Patient ID: Ross Watts, male   DOB: Oct 25, 1965, 47 y.o.   MRN: [id number] [facility] MD Progress Note  01/21/2012 10:47 AM Ross Watts  MRN:  [id number]  Subjective: Patient reports decreased mood swings,depression and racing thoughts. He verbalizes improved sleeping pattern and concentration. However, patient is worried about his financial situation and his job. He is compliant with his medications and has not verbalized any adverse reactions.  Diagnosis:  Bipolar 2 disorder   ADL's:  Intact  Sleep: fair  Appetite:  Fair  Suicidal Ideation:  Plan:  denies Intent:  denies Means:  denies Homicidal Ideation:  Plan:  denies Intent:  denies Means:  denies AEB (as evidenced by):  Psychiatric Specialty Exam: Review of Systems  Constitutional: Negative.   HENT: Negative.   Eyes: Negative.   Respiratory: Negative.   Cardiovascular: Negative.   Gastrointestinal: Negative.   Genitourinary: Negative.   Musculoskeletal: Negative.   Skin: Negative.   Neurological: Negative.   Endo/Heme/Allergies: Negative.   Psychiatric/Behavioral: Positive for depression. The patient is nervous/anxious.     Blood pressure 134/91, pulse 90, temperature 97.8 F (36.6 C), temperature source Oral, resp. rate 20, height 5\' 6"  (1.676 m), weight 141.976 kg (313 lb).Body mass index is 50.52 kg/(m^2).  General Appearance: casual  Eye Contact::  Minimal  Speech:  Garbled and Slow  Volume:  Decreased  Mood:  Depressed and Dysphoric  Affect:  Constricted  Thought Process:  blocking  Orientation:  Full (Time, Place, and Person)  Thought Content:  Rumination  Suicidal Thoughts:  No  Homicidal Thoughts:  No  Memory:  Immediate;   Fair Recent;   Fair Remote;   Fair  Judgement:  Impaired  Insight:  Lacking  Psychomotor Activity:  Psychomotor Retardation  Concentration:  Poor  Recall:  Poor  Akathisia:  No  Handed:  Ambidextrous  AIMS (if indicated):     Assets:  Desire for Improvement  Sleep:   Number of Hours: 6.25    Current Medications: Current Facility-Administered Medications  Medication Dose Route Frequency Provider Last Rate Last Dose  . acetaminophen (TYLENOL) tablet 650 mg  650 mg Oral Q6H PRN Kerry Hough, PA   650 mg at 01/20/12 2014  . alum & mag hydroxide-simeth (MAALOX/MYLANTA) 200-200-20 MG/5ML suspension 30 mL  30 mL Oral Q4H PRN Kerry Hough, PA      . benztropine (COGENTIN) tablet 0.5 mg  0.5 mg Oral BID Mckaylee Dimalanta   0.5 mg at 01/21/12 0805  . cloNIDine (CATAPRES - Dosed in mg/24 hr) patch 0.1 mg  0.1 mg Transdermal Weekly Kerry Hough, PA   0.1 mg at 01/19/12 2249  . fluPHENAZine (PROLIXIN) tablet 5 mg  5 mg Oral BID PC Derren Suydam   5 mg at 01/21/12 0805  . hydrochlorothiazide (MICROZIDE) capsule 12.5 mg  12.5 mg Oral Daily Jisele Price   12.5 mg at 01/21/12 0805   And  . irbesartan (AVAPRO) tablet 150 mg  150 mg Oral Daily Naw Lasala   150 mg at 01/21/12 0805  . magnesium hydroxide (MILK OF MAGNESIA) suspension 30 mL  30 mL Oral Daily PRN Kerry Hough, PA      . nicotine (NICODERM CQ - dosed in mg/24 hours) patch 21 mg  21 mg Transdermal Q0600 Kerry Hough, PA   21 mg at 01/21/12 0805  . PARoxetine (PAXIL) tablet 20 mg  20 mg Oral Daily Li Bobo   20 mg at 01/21/12 0806  . traZODone (DESYREL) tablet  50 mg  50 mg Oral QHS Darryn Kydd        Lab Results:  Results for orders placed during the hospital encounter of 01/19/12 (from the past 48 hour(s))  GLUCOSE, CAPILLARY     Status: Abnormal   Collection Time   01/20/12  5:55 AM      Component Value Range Comment   Glucose-Capillary 101 (*) 70 - 99 mg/dL   GLUCOSE, CAPILLARY     Status: Normal   Collection Time   01/20/12  5:04 PM      Component Value Range Comment   Glucose-Capillary 97  70 - 99 mg/dL     Physical Findings: AIMS: Facial and Oral Movements Muscles of Facial Expression: None, normal Lips and Perioral Area: None, normal Jaw: None, normal Tongue:  None, normal,Extremity Movements Upper (arms, wrists, hands, fingers): None, normal Lower (legs, knees, ankles, toes): None, normal, Trunk Movements Neck, shoulders, hips: None, normal, Overall Severity Severity of abnormal movements (highest score from questions above): None, normal Incapacitation due to abnormal movements: None, normal Patient's awareness of abnormal movements (rate only patient's report): No Awareness, Dental Status Current problems with teeth and/or dentures?: Yes Does patient usually wear dentures?: No  CIWA:  CIWA-Ar Total: 0  COWS:     Treatment Plan Summary: Daily contact with patient to assess and evaluate symptoms and progress in treatment Medication management  Plan: 1.Will continue Prolixin 5mg  po twice daily for mood/psychosis 2. Continue Cogentin 0.5mg  BID for EPS prevention 3. Continue other medication regimen. 4. Patient encouraged to attend group and other unit milieu.  Medical Decision Making Problem Points:  Review of last therapy session (1) and Review of psycho-social stressors (1) Data Points:  Order Aims Assessment (2) Review of medication regiment & side effects (2) Review of new medications or change in dosage (2)  I certify that inpatient services furnished can reasonably be expected to improve the patient's condition.   Gregory Dowe,MD 01/21/2012, 10:47 AM

## 2012-01-21 NOTE — Progress Notes (Signed)
Psychoeducational Group Note  Date:  01/21/2012 Time:  0930  Group Topic/Focus:  Therapeutic Activity  Participation Level:  Active  Participation Quality:  Appropriate and Attentive  Affect:  Appropriate  Cognitive:  Appropriate  Insight:  Engaged  Engagement in Group:  Engaged  Additional Comments: Ahmod attended and participated during group. The group consisted of using the question ball for a therapeutic activity, where the patient would answer a question on the ball and then pass to a fellow group member. Patient was cooperative and appropriate during the group.    [name] 01/21/2012, 11:27 AM

## 2012-01-21 NOTE — Progress Notes (Signed)
[facility] LCSW Aftercare Discharge Planning Group Note  01/21/2012 2:26 PM  Participation Quality:  Attentive  Affect:  Flat  Cognitive:  Alert  Insight:  Improving  Engagement in Group:  Engaged  Modes of Intervention:  Discussion, Exploration and Socialization  Summary of Progress/Problems:Ross Watts stated his plan has changed.  He met with family and partner last night, and rather than go stay with family in [place]., he will stay in [place] with partner.  Reports no problems with switch in meds. Daryel Gerald B 01/21/2012, 2:26 PM

## 2012-01-21 NOTE — Progress Notes (Signed)
Psychoeducational Group Note  Date:  01/21/2012 Time:  2000  Group Topic/Focus:  Wrap-Up Group:   The focus of this group is to help patients review their daily goal of treatment and discuss progress on daily workbooks.  Participation Level:  Active  Participation Quality:  Appropriate  Affect:  Appropriate  Cognitive:  Appropriate  Insight:  Engaged  Engagement in Group:  Engaged  Additional Comments:  Pt attended wrap-up group this evening. Pt stated that he spoke to his employment today regarding his job. Pt was happy to know that he still had a job.   Geraldy Akridge A 01/21/2012, 10:19 PM

## 2012-01-21 NOTE — Progress Notes (Signed)
D: Patient denies SI/HI ; patient reports sleep to be well; reports appetite to be good ; reports energy level is normal ; reports ability to pay attention is good; rates depression as 2/10; rates hopelessness 2/10;  A: Monitored q 15 minutes; patient encouraged to attend groups; patient educated about medications; patient given medications per physician orders; patient encouraged to express feelings and/or concerns  R: Patient is animated when interacting in group but otherwise can have a flat affect; patient is cooperative; patient's interaction with staff and peers is appropriate but minimal;  patient is taking medications as prescribed and tolerating medications; patient is attending all groups

## 2012-01-21 NOTE — Clinical Social Work Note (Signed)
[facility] LCSW Group Therapy  01/21/2012 2:30 PM   Type of Therapy:  Group Therapy  Participation Level:  Active  Participation Quality:  Attentive  Affect:  Appropriate  Cognitive:  Appropriate  Insight:  Improving  Engagement in Therapy:  Engaged  Modes of Intervention:  Clarification, Education, Exploration and Socialization  Summary of Progress/Problems: Today's group focused on relapse prevention.  We defined the term, and then brainstormed on ways to prevent relapse. Koben talked about how his relapse generally happens when he goes off of his meds when he cannot afford them, and how he is feeling optimistic now that all his meds have been switched to $ 4. Formulary.  Daryel Gerald B 01/21/2012 , 2:30 PM

## 2012-01-21 NOTE — Progress Notes (Signed)
Patient ID: Ross Watts, male   DOB: December 14, 1965, 47 y.o.   MRN: [id number]  D: Pt denies SI/HI/AVh. Pt is pleasant and cooperative. Pt feels good , he spoke to his mother. He says he feels good that he still has his job when he gets out.Pt complained of generalized pain 3 out of 10.  A: Pt was offered support and encouragement. Pt was given scheduled medications. Pt was encourage to attend groups. Q 15 minute checks were done for safety.Pt given tylenol 2124 for generalized pain.    R:Pt attends groups and interacts  with peers and staff. Pt is taking medication. Pt has no complaints at this time.Pt receptive to treatment and safety maintained on unit.

## 2012-01-22 DIAGNOSIS — F3189 Other bipolar disorder: Principal | ICD-10-CM

## 2012-01-22 NOTE — Progress Notes (Signed)
[facility] Group Notes:  (Counselor/Nursing/MHT/Case Management/Adjunct)  01/22/2012 10:28 AM  Type of Therapy:  self inventory  Participation Level:  Active  Participation Quality:  Appropriate  Affect:  Appropriate  Cognitive:  Appropriate  Insight:  Supportive  Engagement in Group:  Supportive  Engagement in Therapy:  Developing/Improving  Modes of Intervention:  Discussion  Summary of Progress/Problems:   Ross Watts 01/22/2012, 10:28 AM

## 2012-01-22 NOTE — Progress Notes (Signed)
Psychoeducational Group Note  Date:  01/22/2012 Time:  2000 Group Topic/Focus:  Wrap-Up Group:   The focus of this group is to help patients review their daily goal of treatment and discuss progress on daily workbooks.  Participation Level:  Active  Participation Quality:  Appropriate  Affect:  Appropriate  Cognitive:  Appropriate  Insight:  Engaged  Engagement in Group:  Engaged  Additional Comments:  Patient attended and participated in group tonight. He reports having a O.K day. His partner visited with him, he eat well, and attended his group. He cope by using the rubber ban snap and by talking things out after everyone is calm. He would like to get his medication straighten out before leaving the facility.  Lita Mains [facility] 01/22/2012, 9:41 PM

## 2012-01-22 NOTE — Progress Notes (Signed)
D   Pt is appropriate and pleasant   He reports expecting to be discharged Monday and feels ready for it  He did have questions about his medications and how drowsy they make him and he requested a schedule to assist him on when to take his medications after he goes back to work   Pt is compliant with treatment and interacts well with others A   Verbal support given  Medications administered and effectiveness monitored  Provided pt with a list of medications and recommendations on which ones to take before work and which ones would likely make him drowsy  Q 15 min checks R   Pt safe at present

## 2012-01-22 NOTE — Progress Notes (Signed)
Writer spoke with patient and he reports having had a good day. Patient reports that his patrner visited on today he feels that they worry about each other. Patient reports that he missed the coping group on today but he says that he uses the rubber band to help him cope positively and he reports that it snaps him back. Patient reports his goal is to get his medications straightened out so that he can stay awake while at work. 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 Patient was offered support and encouragement. Patient c/o both hips aching and requested tylenol which he received, writer will monitor effectiveness of medication. Patient denies si/hi/a/v hallucinations, will continue to monitor.

## 2012-01-22 NOTE — Progress Notes (Signed)
Patient ID: Ross Watts, male   DOB: 1965/09/03, 47 y.o.   MRN: [id number] [facility] MD Progress Note  01/22/2012 3:56 PM Ross Watts  MRN:  [id number]  Subjective: "I'm doing Ok." Patient up and visiting with his current roommate whom he knew when he was 17.  Diagnosis:  Bipolar 2 disorder   ADL's:  Intact  Sleep: fair  Appetite:  Fair  Suicidal Ideation:  Plan:  denies Intent:  denies Means:  denies Homicidal Ideation:  Plan:  denies Intent:  denies Means:  denies AEB (as evidenced by):Patient's affect, verbal response, written report on daily self inventory and participation in group programming.  Psychiatric Specialty Exam: Review of Systems  Constitutional: Negative.   HENT: Negative.   Eyes: Negative.   Respiratory: Negative.   Cardiovascular: Negative.   Gastrointestinal: Negative.   Genitourinary: Negative.   Musculoskeletal: Negative.   Skin: Negative.   Neurological: Negative.   Endo/Heme/Allergies: Negative.   Psychiatric/Behavioral: Positive for depression. The patient is nervous/anxious.     Blood pressure 120/82, pulse 91, temperature 97.9 F (36.6 C), temperature source Oral, resp. rate 18, height 5\' 6"  (1.676 m), weight 141.976 kg (313 lb).Body mass index is 50.52 kg/(m^2).  General Appearance: casual  Eye Contact::  Minimal  Speech:  Clear and goal directed  Volume:  Decreased  Mood:  Depressed and Dysphoric  Affect:  Constricted  Thought Process:  blocking  Orientation:  Full (Time, Place, and Person)  Thought Content:  WNL  Suicidal Thoughts:  No  Homicidal Thoughts:  No  Memory:  Immediate;   Fair Recent;   Fair Remote;   Fair  Judgement:  Impaired  Insight:  Lacking  Psychomotor Activity:  Psychomotor Retardation  Concentration:  Poor  Recall:  Poor  Akathisia:  No  Handed:  Ambidextrous  AIMS (if indicated):     Assets:  Desire for Improvement  Sleep:  Number of Hours: 6    Current Medications: Current Facility-Administered  Medications  Medication Dose Route Frequency Provider Last Rate Last Dose  . acetaminophen (TYLENOL) tablet 650 mg  650 mg Oral Q6H PRN Kerry Hough, PA   650 mg at 01/21/12 2124  . alum & mag hydroxide-simeth (MAALOX/MYLANTA) 200-200-20 MG/5ML suspension 30 mL  30 mL Oral Q4H PRN Kerry Hough, PA      . benztropine (COGENTIN) tablet 0.5 mg  0.5 mg Oral BID Mojeed Akintayo   0.5 mg at 01/22/12 0809  . cloNIDine (CATAPRES - Dosed in mg/24 hr) patch 0.1 mg  0.1 mg Transdermal Weekly Kerry Hough, PA   0.1 mg at 01/19/12 2249  . fluPHENAZine (PROLIXIN) tablet 5 mg  5 mg Oral BID PC Mojeed Akintayo   5 mg at 01/22/12 0809  . hydrochlorothiazide (MICROZIDE) capsule 12.5 mg  12.5 mg Oral Daily Mojeed Akintayo   12.5 mg at 01/22/12 0809   And  . irbesartan (AVAPRO) tablet 150 mg  150 mg Oral Daily Mojeed Akintayo   150 mg at 01/22/12 0809  . magnesium hydroxide (MILK OF MAGNESIA) suspension 30 mL  30 mL Oral Daily PRN Kerry Hough, PA      . nicotine (NICODERM CQ - dosed in mg/24 hours) patch 21 mg  21 mg Transdermal Q0600 Kerry Hough, PA   21 mg at 01/22/12 0640  . PARoxetine (PAXIL) tablet 20 mg  20 mg Oral Daily Mojeed Akintayo   20 mg at 01/22/12 0808  . traZODone (DESYREL) tablet 50 mg  50 mg Oral  QHS Mojeed Akintayo   50 mg at 01/21/12 2124    Lab Results:  Results for orders placed during the hospital encounter of 01/19/12 (from the past 48 hour(s))  GLUCOSE, CAPILLARY     Status: Normal   Collection Time   01/20/12  5:04 PM      Component Value Range Comment   Glucose-Capillary 97  70 - 99 mg/dL     Physical Findings: AIMS: Facial and Oral Movements Muscles of Facial Expression: None, normal Lips and Perioral Area: None, normal Jaw: None, normal Tongue: None, normal,Extremity Movements Upper (arms, wrists, hands, fingers): None, normal Lower (legs, knees, ankles, toes): None, normal, Trunk Movements Neck, shoulders, hips: None, normal, Overall Severity Severity of  abnormal movements (highest score from questions above): None, normal Incapacitation due to abnormal movements: None, normal Patient's awareness of abnormal movements (rate only patient's report): No Awareness, Dental Status Current problems with teeth and/or dentures?: Yes Does patient usually wear dentures?: No  CIWA:  CIWA-Ar Total: 0  COWS:     Treatment Plan Summary: Daily contact with patient to assess and evaluate symptoms and progress in treatment Medication management  Plan: 1.Will continue Prolixin 5mg  po twice daily for mood/psychosis 2. Continue Cogentin 0.5mg  BID for EPS prevention 3. Continue other medication regimen. 4. Patient encouraged to attend group and other unit milieu.  Medical Decision Making Problem Points:  Review of last therapy session (1) and Review of psycho-social stressors (1) Data Points:  Order Aims Assessment (2) Review of medication regiment & side effects (2) Review of new medications or change in dosage (2)  I certify that inpatient services furnished can reasonably be expected to improve the patient's condition.   Rona Ravens. Cris Talavera [facility] 01/22/2012 4:08 PM

## 2012-01-22 NOTE — Clinical Social Work Psychosocial (Signed)
[facility] Group Notes:  (Clinical Social Work)  01/22/2012  11:15-11:45AM  Summary of Progress/Problems:   The main focus of today's process group was for the patient to identify ways in which they have in the past sabotaged their own recovery and reasons they may have done this/what they received from doing it.  We then worked to identify a specific plan to avoid doing this when discharged from the hospital for this admission.  The patient expressed that he is in the hospital because he went off his bipolar medication for about 1-1/2 years, and started becoming manic.  He stated he went off the meds because he started feeling depressed regardless of taking the meds, and found that he felt better without them.  He does feel now that he needs them.  He will return to live in an apartment with his partner.  Type of Therapy:  Group Therapy - Process  Participation Level:  Active  Participation Quality:  Appropriate and Attentive  Affect:  Blunted  Cognitive:  Appropriate  Insight:  Engaged  Engagement in Therapy:  Engaged  Modes of Intervention:  Clarification, Education, Limit-setting, Problem-solving, Socialization, Support and Processing, Exploration, Discussion   Ambrose Mantle, LCSW 01/22/2012, 12:53 PM

## 2012-01-23 NOTE — Progress Notes (Signed)
Psychoeducational Group Note  Date:  01/23/2012 Time:  0945 am  Group Topic/Focus:  Making Healthy Choices:   The focus of this group is to help patients identify negative/unhealthy choices they were using prior to admission and identify positive/healthier coping strategies to replace them upon discharge.  Participation Level:  Minimal  Participation Quality:  Resistant  Affect:  Appropriate  Cognitive:  Alert  Insight:  Improving  Engagement in Group:  Limited  Additional Comments:    Andrena Mews 01/23/2012, 10:29 AM

## 2012-01-23 NOTE — Clinical Social Work Psychosocial (Signed)
[facility] Group Notes:  (Clinical Social Work)  01/23/2012   9:30-10am  Summary of Progress/Problems:  The main focus of today's process group was to listen to a variety of genres of music and to identify that different types of music provoke different responses.  The patient then was able to identify personally what was soothing for them, as well as energizing.  Examples were given of how to use this knowledge in sleep habits, with depression, and with other symptoms.  The patient expressed that he felt most of the music relaxed or energized him.  Type of Therapy:  Music Therapy with processing done  Participation Level:  Active  Participation Quality:  Attentive and Sharing  Affect:  Blunted  Cognitive:  Appropriate and Oriented  Insight:  Engaged  Engagement in Therapy:  Engaged  Modes of Intervention:   Socialization, Teacher, English as a foreign language, Exploration, Education, [place]   [employer], LCSW 01/23/2012, 11:42 AM

## 2012-01-23 NOTE — Progress Notes (Signed)
Patient attended group this evening and reports that he did not make groups during the day d/t sleeping. Patient c/o right hip pain earlier and received 2 tylenol and an ice pack reporting that he wanted to take care of the pain before it got any worse. Patient reports to having had a good day and when asked who his support system was he reported that his mom, sister, brother, co-workers and his partner are his support system. Patient denies si/hi/a/v hallucinations, patient is hopeful to discharge on tomorrow. Safety maintained on unit, will continue to monitor.

## 2012-01-23 NOTE — Progress Notes (Signed)
D   Pt has been appropriate and pleasant   He denies SI/HI and any hallucinations  His thinking is logical and coherent  He is quiet and cooperative  He interacts well with select peers A   Verbal support given  Medications administered and effectiveness monitored  Q 15 min checks R   Pt safe at present

## 2012-01-23 NOTE — Progress Notes (Signed)
Patient ID: Ross Watts, male   DOB: 1965/08/05, 47 y.o.   MRN: [id number] Patient ID: Ross Watts, male   DOB: [birth year], 47 y.o.   MRN: [id number] [facility] MD Progress Note  01/23/2012 10:34 AM Abrar ERIKA HUSSAR  MRN:  [id number]  Subjective: "I'm doing Ok."  Worries about when to take medication when he gets home due to his work schedule. Diagnosis:  Bipolar 2 disorder   ADL's:  Intact  Sleep: fair  Appetite:  Fair  Suicidal Ideation:  Plan:  denies Intent:  denies Means:  denies Homicidal Ideation:  Plan:  denies Intent:  denies Means:  denies AEB (as evidenced by):Patient's affect, verbal response, written report on daily self inventory and participation in group programming.  Psychiatric Specialty Exam: Review of Systems  Constitutional: Negative.   HENT: Negative.   Eyes: Negative.   Respiratory: Negative.   Cardiovascular: Negative.   Gastrointestinal: Negative.   Genitourinary: Negative.   Musculoskeletal: Negative.   Skin: Negative.   Neurological: Negative.   Endo/Heme/Allergies: Negative.   Psychiatric/Behavioral: Positive for depression. The patient is nervous/anxious.     Blood pressure 120/80, pulse 91, temperature 98.2 F (36.8 C), temperature source Oral, resp. rate 20, height 5\' 6"  (1.676 m), weight 141.976 kg (313 lb).Body mass index is 50.52 kg/(m^2).  General Appearance: casual  Eye Contact::  good  Speech:  Clear and goal directed  Volume:  Decreased  Mood:  Depressed and Dysphoric  Affect:  Brighter today  Thought Process:  Goal Directed and coherent  Orientation:  Full (Time, Place, and Person)  Thought Content: auditory hallucinations are decreasing  Suicidal Thoughts:  No  Homicidal Thoughts:  No  Memory:  Immediate;   Fair Recent;   Fair Remote;   Fair  Judgement:  Impaired  Insight:  Lacking  Psychomotor Activity:  Normal  Concentration:  Fair  Recall:  Poor  Akathisia:  No  Handed:  Ambidextrous  AIMS (if indicated):       Assets:  Desire for Improvement  Sleep:  Number of Hours: 6.5    Current Medications: Current Facility-Administered Medications  Medication Dose Route Frequency Provider Last Rate Last Dose  . acetaminophen (TYLENOL) tablet 650 mg  650 mg Oral Q6H PRN Kerry Hough, PA   650 mg at 01/21/12 2124  . alum & mag hydroxide-simeth (MAALOX/MYLANTA) 200-200-20 MG/5ML suspension 30 mL  30 mL Oral Q4H PRN Kerry Hough, PA      . benztropine (COGENTIN) tablet 0.5 mg  0.5 mg Oral BID Mojeed Akintayo   0.5 mg at 01/22/12 0809  . cloNIDine (CATAPRES - Dosed in mg/24 hr) patch 0.1 mg  0.1 mg Transdermal Weekly Kerry Hough, PA   0.1 mg at 01/19/12 2249  . fluPHENAZine (PROLIXIN) tablet 5 mg  5 mg Oral BID PC Mojeed Akintayo   5 mg at 01/22/12 0809  . hydrochlorothiazide (MICROZIDE) capsule 12.5 mg  12.5 mg Oral Daily Mojeed Akintayo   12.5 mg at 01/22/12 0809   And  . irbesartan (AVAPRO) tablet 150 mg  150 mg Oral Daily Mojeed Akintayo   150 mg at 01/22/12 0809  . magnesium hydroxide (MILK OF MAGNESIA) suspension 30 mL  30 mL Oral Daily PRN Kerry Hough, PA      . nicotine (NICODERM CQ - dosed in mg/24 hours) patch 21 mg  21 mg Transdermal Q0600 Kerry Hough, PA   21 mg at 01/22/12 0640  . PARoxetine (PAXIL) tablet 20 mg  20 mg  Oral Daily Mojeed Akintayo   20 mg at 01/22/12 9604  . traZODone (DESYREL) tablet 50 mg  50 mg Oral QHS Mojeed Akintayo   50 mg at 01/21/12 2124    Lab Results:  Results for orders placed during the hospital encounter of 01/19/12 (from the past 48 hour(s))  GLUCOSE, CAPILLARY     Status: Normal   Collection Time   01/20/12  5:04 PM      Component Value Range Comment   Glucose-Capillary 97  70 - 99 mg/dL     Physical Findings: AIMS: Facial and Oral Movements Muscles of Facial Expression: None, normal Lips and Perioral Area: None, normal Jaw: None, normal Tongue: None, normal,Extremity Movements Upper (arms, wrists, hands, fingers): None, normal Lower (legs,  knees, ankles, toes): None, normal, Trunk Movements Neck, shoulders, hips: None, normal, Overall Severity Severity of abnormal movements (highest score from questions above): None, normal Incapacitation due to abnormal movements: None, normal Patient's awareness of abnormal movements (rate only patient's report): No Awareness, Dental Status Current problems with teeth and/or dentures?: Yes Does patient usually wear dentures?: No  CIWA:  CIWA-Ar Total: 0  COWS:     Treatment Plan Summary: Daily contact with patient to assess and evaluate symptoms and progress in treatment Medication management  Plan: 1. Will continue Prolixin 5mg  po twice daily for mood/psychosis 2. Continue Cogentin 0.5mg  BID for EPS prevention 3. Continue other medication regimen. 4. Patient encouraged to attend group and other unit milieu.  Medical Decision Making Problem Points:  Review of last therapy session (1) and Review of psycho-social stressors (1) Data Points:  Order Aims Assessment (2) Review of medication regiment & side effects (2) Review of new medications or change in dosage (2)  I certify that inpatient services furnished can reasonably be expected to improve the patient's condition.   Rona Ravens. Shirley [facility] 01/23/2012 10:34 AM

## 2012-01-24 MED ORDER — IRBESARTAN 150 MG PO TABS: 150.0000 mg | ORAL_TABLET | Freq: Every day | ORAL | Status: DC

## 2012-01-24 MED ORDER — PAROXETINE HCL 20 MG PO TABS: 20.0000 mg | ORAL_TABLET | Freq: Every day | ORAL | Status: DC

## 2012-01-24 MED ORDER — FLUPHENAZINE HCL 5 MG PO TABS: 5.0000 mg | ORAL_TABLET | Freq: Two times a day (BID) | ORAL | Status: DC

## 2012-01-24 MED ORDER — BENZTROPINE MESYLATE 0.5 MG PO TABS: 0.5000 mg | ORAL_TABLET | Freq: Two times a day (BID) | ORAL | Status: DC

## 2012-01-24 MED ORDER — CLONIDINE HCL 0.1 MG/24HR TD PTWK: 1.0000 | MEDICATED_PATCH | TRANSDERMAL | Status: DC

## 2012-01-24 MED ORDER — TRAZODONE HCL 50 MG PO TABS: 50.0000 mg | ORAL_TABLET | Freq: Every day | ORAL | Status: DC

## 2012-01-24 MED ORDER — HYDROCHLOROTHIAZIDE 12.5 MG PO CAPS: 12.5000 mg | ORAL_CAPSULE | Freq: Every day | ORAL | Status: DC

## 2012-01-24 NOTE — Progress Notes (Signed)
Psychoeducational Group Note  Date:  01/24/2012 Time:  2000  Group Topic/Focus:  Wrap-Up Group:   The focus of this group is to help patients review their daily goal of treatment and discuss progress on daily workbooks.  Participation Level:  Active  Participation Quality:  Appropriate  Affect:  Appropriate  Cognitive:  Appropriate  Insight:  Engaged  Engagement in Group:  Engaged  Additional Comments:  Patient attended and participated in group tonight. He reports that his day went well. He missed his groups because he was sleeping. He did have all his meals. Patient advised that his support system is God and his co-workers  Scot Dock 01/24/2012, 12:11 AM

## 2012-01-24 NOTE — Progress Notes (Signed)
[facility] INPATIENT:  Family/Significant Other Suicide Prevention Education  Suicide Prevention Education:  Education Completed; Joaquin Music, partner, [phone] has been identified by the patient as the family member/significant other with whom the patient will be residing, and identified as the person(s) who will aid the patient in the event of a mental health crisis (suicidal ideations/suicide attempt).  With written consent from the patient, the family member/significant other has been provided the following suicide prevention education, prior to the and/or following the discharge of the patient.  The suicide prevention education provided includes the following:  Suicide risk factors  Suicide prevention and interventions  National Suicide Hotline telephone number  [employer] assessment telephone number  [facility] Emergency Assistance 911  [facility] and/or Residential Mobile Crisis Unit telephone number  Request made of family/significant other to:  Remove weapons (e.g., guns, rifles, knives), all items previously/currently identified as safety concern.    Remove drugs/medications (over-the-counter, prescriptions, illicit drugs), all items previously/currently identified as a safety concern.  The family member/significant other verbalizes understanding of the suicide prevention education information provided.  The family member/significant other agrees to remove the items of safety concern listed above.  Daryel Gerald B 01/24/2012, 4:15 PM

## 2012-01-24 NOTE — Progress Notes (Signed)
[facility] Adult Case Management Discharge Plan :  Will you be returning to the same living situation after discharge: Yes,  home with partner At discharge, do you have transportation home?:Yes,  same Do you have the ability to pay for your medications:Yes,  mental health  Release of information consent forms completed and in the chart;  Patient's signature needed at discharge.  Patient to Follow up at: Follow-up Information    Follow up with [facility]. (Go to the walk-in clinic on Thursday or Friday between the hours of 8 and 9AM.  This is where you will see a psychiatrist for your medication.)    Contact information:   [address]  [place]  [phone]         Patient denies SI/HI:   Yes,  yes    Safety Planning and Suicide Prevention discussed:  Yes,  yes  Ida Rogue 01/24/2012, 10:34 AM

## 2012-01-24 NOTE — [facility] Suicide Risk Assessment (Signed)
Suicide Risk Assessment  Discharge Assessment     Demographic Factors:  Male, Ross Watts, lesbian, or bisexual orientation and Low socioeconomic status  Mental Status Per Nursing Assessment::   On Admission:  NA  Current Mental Status by Physician: NA  Loss Factors: Financial problems/change in socioeconomic status  Historical Factors: NA  Risk Reduction Factors:   Positive social support and Positive therapeutic relationship  Continued Clinical Symptoms:  Resolving depressive symptoms  Cognitive Features That Contribute To Risk:  Closed-mindedness    Suicide Risk:  Minimal: No identifiable suicidal ideation.  Patients presenting with no risk factors but with morbid ruminations; may be classified as minimal risk based on the severity of the depressive symptoms  Discharge Diagnoses:   AXIS I:  Bipolar 2 disorder  AXIS II:  Deferred AXIS III:   Past Medical History  Diagnosis Date   AXIS IV:  economic problems and other psychosocial or environmental problems AXIS V:  61-70 mild symptoms  Plan Of Care/Follow-up recommendations:  Activity:  as tolerated Diet:  healthy Tests:  routine Other:  patient to keep his after care appointment  Is patient on multiple antipsychotic therapies at discharge:  No   Has Patient had three or more failed trials of antipsychotic monotherapy by history:  No  Recommended Plan for Multiple Antipsychotic Therapies: N/A  Ross Sobol,MD 01/24/2012, 10:46 AM

## 2012-01-24 NOTE — Discharge Summary (Signed)
Physician Discharge Summary Note  Patient:  Ross Watts is an 47 y.o., male MRN:  [id number] DOB:  17-Nov-1965 Patient phone:  [phone] (home)  Patient address:   [address] [place] [address],   Date of Admission:  01/19/2012 Date of Discharge: 01/24/2012  Reason for Admission:  Bipolar disorder most recent episode depressed  Discharge Diagnoses: Principal Problem:  *Bipolar 2 disorder Active Problems:  Hypertension  Non-compliant patient Discharge Diagnoses:  AXIS I: Bipolar 2 disorder  AXIS II: Deferred  AXIS III:  Past Medical History   Diagnosis  Date   AXIS IV: economic problems and other psychosocial or environmental problems  AXIS V: 61-70 mild symptoms Review of Systems  Constitutional: Negative.  Negative for fever, chills, weight loss, malaise/fatigue and diaphoresis.  HENT: Negative for congestion and sore throat.   Eyes: Negative for blurred vision, double vision and photophobia.  Respiratory: Negative for cough, shortness of breath and wheezing.   Cardiovascular: Negative for chest pain, palpitations and PND.  Gastrointestinal: Negative for heartburn, nausea, vomiting, abdominal pain, diarrhea and constipation.  Musculoskeletal: Negative for myalgias, joint pain and falls.  Neurological: Negative for dizziness, tingling, tremors, sensory change, speech change, focal weakness, seizures, loss of consciousness, weakness and headaches.  Endo/Heme/Allergies: Negative for polydipsia. Does not bruise/bleed easily.  Psychiatric/Behavioral: Negative for depression, suicidal ideas, hallucinations, memory loss and substance abuse. The patient is not nervous/anxious and does not have insomnia.     Level of Care:  OP  Hospital Course:  This was a voluntary admission for Ross Watts who goes by Ross Watts, who presented to the ED reporting increasing symptoms of depression including mood swings, racing thoughts, poor concentration, crying jags, irritability,  low energy level, poor motivation for self care, and hearing voices. He reported he had not taken any of his medications for over a year for his psychiatric disorder as well as his hypertension and diabetic medication. While he denied suicidal or homicidal ideation he stated he didn't want to be here anymore and that  He "just didn't care."       He was given medical clearance and transferred to Behavioral health for further stabilization, crisis management, and medication management.  For his symptoms of psychosis he was started on Prolixin 5mg  tablets, for his depression he was treated with Paxil 20mg  a day, and for sleep he was given trazodone 50mg  at hs. To cover for side effects he was placed on Cogentin 0.5mg  a day. For his hypertension he was given Catapress 0.1mg  patch, Microzide 12.5mg  capsule, and Avapro 150mg  tablet each morning.      Ross Watts was evaluated daily by clinical providers to assess his response to treatment. He attended unit programming and therapeutic groups each day.  As he stabilized and returned to baseline function he reported a reduction in symptoms, and displayed a more positive affect. He verbalized a good plan for follow up care upon discharge and worked closely with providers to develop a good medication schedule for when he returned to work.      On the day of discharge Ross Watts was in full contact with reality and felt ready to return home to begin the next phase in his life. He was in much improved condition than upon admission and was stable to discharge out.  He denied SI/HI, reported no AVH. His affect was positive and he was anxious to return to work.   Consults:  None  Significant Diagnostic Studies:  labs: Please see all labs and diagnostic  testing associated with this admission via EMR  Discharge Vitals:   Blood pressure 127/85, pulse 87, temperature 97.3 F (36.3 C), temperature source Oral, resp. rate 20, height 5\' 6"  (1.676 m), weight 141.976 kg (313 lb). Body  mass index is 50.52 kg/(m^2). Lab Results:   No results found for this or any previous visit (from the past 72 hour(s)).  Physical Findings: AIMS: Facial and Oral Movements Muscles of Facial Expression: None, normal Lips and Perioral Area: None, normal Jaw: None, normal Tongue: None, normal,Extremity Movements Upper (arms, wrists, hands, fingers): None, normal Lower (legs, knees, ankles, toes): None, normal, Trunk Movements Neck, shoulders, hips: None, normal, Overall Severity Severity of abnormal movements (highest score from questions above): None, normal Incapacitation due to abnormal movements: None, normal Patient's awareness of abnormal movements (rate only patient's report): No Awareness, Dental Status Current problems with teeth and/or dentures?: Yes Does patient usually wear dentures?: No  CIWA:  CIWA-Ar Total: 0  COWS:     Psychiatric Specialty Exam: See Psychiatric Specialty Exam and Suicide Risk Assessment completed by Attending Physician prior to discharge.  Discharge destination:  Home  Is patient on multiple antipsychotic therapies at discharge:  No   Has Patient had three or more failed trials of antipsychotic monotherapy by history:  No  Recommended Plan for Multiple Antipsychotic Therapies:  Not applicable  Discharge Orders    Future Orders Please Complete By Expires   Diet - low sodium heart healthy      Increase activity slowly      Discharge instructions      Comments:   Take all your medications as prescribed by your mental healthcare provider. Report any adverse effects and or reactions from your medicines to your outpatient provider promptly. Patient is instructed and cautioned to not engage in alcohol and or illegal drug use while on prescription medicines. In the event of worsening symptoms, patient is instructed to call the crisis hotline, 911 and or go to the nearest ED for appropriate evaluation and treatment of symptoms. Follow-up with your  primary care provider for your other medical issues, concerns and or health care needs.       Medication List     As of 01/24/2012 10:35 AM    STOP taking these medications         ALPRAZolam 1 MG tablet   Commonly known as: XANAX      BC FAST PAIN RELIEF ARTHRITIS PO      HYDROcodone-acetaminophen 5-325 MG per tablet   Commonly known as: NORCO/VICODIN      NYQUIL COLD & FLU PO      olmesartan-hydrochlorothiazide 20-12.5 MG per tablet   Commonly known as: BENICAR HCT      temazepam 30 MG capsule   Commonly known as: RESTORIL      ziprasidone 60 MG capsule   Commonly known as: GEODON      TAKE these medications      Indication    benztropine 0.5 MG tablet   Commonly known as: COGENTIN   Take 1 tablet (0.5 mg total) by mouth 2 (two) times daily. For side effects    Indication: Extrapyramidal Reaction caused by Medications      cloNIDine 0.1 mg/24hr patch   Commonly known as: CATAPRES - Dosed in mg/24 hr   Place 1 patch (0.1 mg total) onto the skin once a week. For hypertension.    Indication: High Blood Pressure      fluPHENAZine 5 MG tablet   Commonly known  as: PROLIXIN   Take 1 tablet (5 mg total) by mouth 2 (two) times daily after a meal. For psychosis and mental clarity.    Indication: Psychosis      hydrochlorothiazide 12.5 MG capsule   Commonly known as: MICROZIDE   Take 1 capsule (12.5 mg total) by mouth daily. For hypertension.    Indication: Edema, High Blood Pressure      irbesartan 150 MG tablet   Commonly known as: AVAPRO   Take 1 tablet (150 mg total) by mouth daily. For hypertension.    Indication: High Blood Pressure      PARoxetine 20 MG tablet   Commonly known as: PAXIL   Take 1 tablet (20 mg total) by mouth daily. For anxiety and depression.    Indication: Depressive Phase of Manic-Depression      traZODone 50 MG tablet   Commonly known as: DESYREL   Take 1 tablet (50 mg total) by mouth at bedtime. For insomnia.    Indication: Trouble  Sleeping           Follow-up Information    Follow up with [facility]. (Go to the walk-in clinic on Thursday or Friday between the hours of 8 and 9AM.  This is where you will see a psychiatrist for your medication.)    Contact information:   [address]  [place]  [phone]         Follow-up recommendations:  As noted above  Comments:    Total Discharge Time:  >30 minutes  Signed: Lloyd Huger T. Mykael Batz PAC 01/24/2012, 10:35 AM

## 2012-01-24 NOTE — Progress Notes (Signed)
Patient ID: Ross Watts, male   DOB: 1965-01-20, 47 y.o.   MRN: [id number] Patient discharged home per MD order.  Patient received all belongings, prescriptions and medication samples.  Discharge instructions were reviewed with patient and follow up instructions were understood.  Patient denies any SI/HI/AVH.  He left ambulatory with his sister.  Patient is vested in his continuing treatment and was appreciative of his care here.

## 2012-01-27 NOTE — Progress Notes (Signed)
Patient Discharge Instructions:  After Visit Summary (AVS):   Faxed to:  01/27/12 Psychiatric Admission Assessment Note:   Faxed to:  01/27/12 Suicide Risk Assessment - Discharge Assessment:   Faxed to:  01/27/12 Faxed/Sent to the Next Level Care provider:  01/27/12 Faxed to [facility] @ [phone]  Jerelene Redden, 01/27/2012, 1:53 PM

## 2012-02-02 ENCOUNTER — Encounter (HOSPITAL_COMMUNITY): Payer: Self-pay | Admitting: Physician Assistant

## 2012-02-03 NOTE — Discharge Summary (Signed)
Seen and agreed. Jared Whorley, MD 

## 2013-04-20 ENCOUNTER — Emergency Department (HOSPITAL_COMMUNITY)
Admission: EM | Admit: 2013-04-20 | Discharge: 2013-04-21 | Disposition: A | Discharge status: Home / Self Care | Payer: Self-pay | Attending: Emergency Medicine | Admitting: Emergency Medicine

## 2013-04-20 ENCOUNTER — Emergency Department (HOSPITAL_COMMUNITY): Payer: Self-pay

## 2013-04-20 ENCOUNTER — Encounter (HOSPITAL_COMMUNITY): Payer: Self-pay | Admitting: Emergency Medicine

## 2013-04-20 DIAGNOSIS — R4789 Other speech disturbances: Secondary | ICD-10-CM | POA: Insufficient documentation

## 2013-04-20 DIAGNOSIS — F1390 Sedative, hypnotic, or anxiolytic use, unspecified, uncomplicated: Secondary | ICD-10-CM

## 2013-04-20 DIAGNOSIS — F319 Bipolar disorder, unspecified: Secondary | ICD-10-CM | POA: Insufficient documentation

## 2013-04-20 DIAGNOSIS — F17200 Nicotine dependence, unspecified, uncomplicated: Secondary | ICD-10-CM | POA: Insufficient documentation

## 2013-04-20 DIAGNOSIS — Y9389 Activity, other specified: Secondary | ICD-10-CM | POA: Insufficient documentation

## 2013-04-20 DIAGNOSIS — Y929 Unspecified place or not applicable: Secondary | ICD-10-CM | POA: Insufficient documentation

## 2013-04-20 DIAGNOSIS — Z79899 Other long term (current) drug therapy: Secondary | ICD-10-CM | POA: Insufficient documentation

## 2013-04-20 DIAGNOSIS — T424X4A Poisoning by benzodiazepines, undetermined, initial encounter: Secondary | ICD-10-CM | POA: Insufficient documentation

## 2013-04-20 DIAGNOSIS — R4701 Aphasia: Secondary | ICD-10-CM | POA: Insufficient documentation

## 2013-04-20 DIAGNOSIS — R5383 Other fatigue: Secondary | ICD-10-CM

## 2013-04-20 DIAGNOSIS — I10 Essential (primary) hypertension: Secondary | ICD-10-CM | POA: Insufficient documentation

## 2013-04-20 DIAGNOSIS — R51 Headache: Secondary | ICD-10-CM | POA: Insufficient documentation

## 2013-04-20 DIAGNOSIS — T43591A Poisoning by other antipsychotics and neuroleptics, accidental (unintentional), initial encounter: Secondary | ICD-10-CM | POA: Insufficient documentation

## 2013-04-20 DIAGNOSIS — F1310 Sedative, hypnotic or anxiolytic abuse, uncomplicated: Secondary | ICD-10-CM

## 2013-04-20 DIAGNOSIS — R6889 Other general symptoms and signs: Secondary | ICD-10-CM | POA: Insufficient documentation

## 2013-04-20 DIAGNOSIS — R5381 Other malaise: Secondary | ICD-10-CM | POA: Insufficient documentation

## 2013-04-20 LAB — CBC
HCT: 42.3 % (ref 39.0–52.0)
HEMOGLOBIN: 14.7 g/dL (ref 13.0–17.0)
MCH: 32.1 pg (ref 26.0–34.0)
MCHC: 34.8 g/dL (ref 30.0–36.0)
MCV: 92.4 fL (ref 78.0–100.0)
PLATELETS: 242 10*3/uL (ref 150–400)
RBC: 4.58 MIL/uL (ref 4.22–5.81)
RDW: 12.5 % (ref 11.5–15.5)
WBC: 12.9 10*3/uL — ABNORMAL HIGH (ref 4.0–10.5)

## 2013-04-20 LAB — COMPREHENSIVE METABOLIC PANEL
ALT: 17 U/L (ref 0–53)
AST: 17 U/L (ref 0–37)
Albumin: 3.6 g/dL (ref 3.5–5.2)
Alkaline Phosphatase: 94 U/L (ref 39–117)
BILIRUBIN TOTAL: 0.4 mg/dL (ref 0.3–1.2)
BUN: 16 mg/dL (ref 6–23)
CALCIUM: 9.3 mg/dL (ref 8.4–10.5)
CHLORIDE: 97 meq/L (ref 96–112)
CO2: 27 meq/L (ref 19–32)
CREATININE: 0.99 mg/dL (ref 0.50–1.35)
Glucose, Bld: 146 mg/dL — ABNORMAL HIGH (ref 70–99)
Potassium: 3.2 mEq/L — ABNORMAL LOW (ref 3.7–5.3)
SODIUM: 137 meq/L (ref 137–147)
Total Protein: 7 g/dL (ref 6.0–8.3)

## 2013-04-20 LAB — RAPID URINE DRUG SCREEN, HOSP PERFORMED
Amphetamines: NOT DETECTED
Barbiturates: NOT DETECTED
Benzodiazepines: POSITIVE — AB
Cocaine: NOT DETECTED
Opiates: NOT DETECTED
Tetrahydrocannabinol: NOT DETECTED

## 2013-04-20 LAB — URINALYSIS, ROUTINE W REFLEX MICROSCOPIC
BILIRUBIN URINE: NEGATIVE
Glucose, UA: NEGATIVE mg/dL
Hgb urine dipstick: NEGATIVE
Ketones, ur: NEGATIVE mg/dL
LEUKOCYTES UA: NEGATIVE
NITRITE: NEGATIVE
Protein, ur: NEGATIVE mg/dL
SPECIFIC GRAVITY, URINE: 1.014 (ref 1.005–1.030)
UROBILINOGEN UA: 0.2 mg/dL (ref 0.0–1.0)
pH: 6.5 (ref 5.0–8.0)

## 2013-04-20 LAB — ACETAMINOPHEN LEVEL

## 2013-04-20 LAB — ETHANOL

## 2013-04-20 LAB — SALICYLATE LEVEL

## 2013-04-20 LAB — TROPONIN I

## 2013-04-20 MED ORDER — SODIUM CHLORIDE 0.9 % IV BOLUS (SEPSIS)
1000.0000 mL | Freq: Once | INTRAVENOUS | Status: AC
  Administered 2013-04-20: 1000 mL via INTRAVENOUS

## 2013-04-20 NOTE — ED Provider Notes (Signed)
Patient seen/examined in the Emergency Department in conjunction with Midlevel Provider [place] Patient reports he feels drowsy due to taking multiple xanax tablets Exam : somnolent but easily arousable.  No facial droop.  No arm drift Plan: monitored in ED then consult psych    Joya Gaskinsonald W Chyanne Kohut, MD 04/20/13 2255

## 2013-04-20 NOTE — ED Notes (Addendum)
Spoke with poison control representative Jola BabinskiMarilyn.

## 2013-04-20 NOTE — ED Notes (Signed)
Pt still arousable. A&Ox4. No complaints at this time.

## 2013-04-20 NOTE — ED Notes (Signed)
Pt comes in from home with headache, slurred speech, lethargic. Pt last known well is unknown.  Pt has chronic back pain and on narcotics for that as well xanax for anxiety. Pt xanax bottle is empty pt took about 8-9 1mg  tabs today to help him sleep.

## 2013-04-20 NOTE — ED Provider Notes (Signed)
CSN: [id number]     Arrival date & time 04/20/13  1828 History   First MD Initiated Contact with Patient 04/20/13 1922     Chief Complaint  Patient presents with  . Headache  . Aphasia  . Ingestion     (Consider location/radiation/quality/duration/timing/severity/associated sxs/prior Treatment) The history is provided by the patient. No language interpreter was used.  Ross Watts is a 48 year old male with past medical history of bipolar disorder, depression, hypertension, mental disorder presenting to the ED with overuse of Xanax. Reported that he was going to a manic episode where he has not slept within 3 days-reported that he would take a nap for at least 1-2 hours, but never received a full sleep. Stated that he was so exhausted that all he wanted to do was sleep so he took a Xanax to "knock me out"-reported that he took at least 8-9 tablets of 1 mg Xanax. Patient reported that this is an attempt to hurt himself -reported that he just wanted to sleep. Patient reported that his sister called EMS. As per report the next bottle of empty do to patient taking 8-9 tablets of 1 mg Xanax. 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 Reported that he does not drive a car. Denied chest pain, shortness of breath, difficulty breathing, abdominal pain, nausea, vomiting, diarrhea, numbness, tingling, headache, blurred vision, sudden loss of vision, hematuria, melena, hematochezia. Denied marijuana, heroin, cocaine, alcohol abuse. Denies homicidal ideation, suicidal ideation, auditory visual hallucinations. PCP Dr. Charm BargesButler.  Past Medical History  Diagnosis Date  . Mental disorder   . Shortness of breath   . Hypertension    History reviewed. No pertinent past surgical history. No family history on file. History  Substance Use Topics  . Smoking status: Current Every Day Smoker -- 2.50 packs/day for  25 years    Types: Cigarettes  . Smokeless tobacco: Not on file  . Alcohol Use: No    Review of Systems  Constitutional: Negative for fever and chills.  Eyes: Negative for visual disturbance.  Respiratory: Negative for chest tightness and shortness of breath.   Cardiovascular: Negative for chest pain.  Gastrointestinal: Negative for nausea, vomiting, abdominal pain and diarrhea.  Genitourinary: Negative for dysuria and hematuria.  Neurological: Negative for dizziness and headaches.  All other systems reviewed and are negative.     Allergies  Sulfa antibiotics  Home Medications   Current Outpatient Rx  Name  Route  Sig  Dispense  Refill  . ALPRAZolam (XANAX) 1 MG tablet   Oral   Take 1 mg by mouth 2 (two) times daily as needed for anxiety.         Marland Kitchen. HYDROcodone-acetaminophen (NORCO) 7.5-325 MG per tablet   Oral   Take 1 tablet by mouth every 6 (six) hours as needed for moderate pain.         Marland Kitchen. lisinopril (PRINIVIL,ZESTRIL) 20 MG tablet   Oral   Take 20 mg by mouth daily.         Marland Kitchen. OLANZapine-FLUoxetine (SYMBYAX) 6-50 MG per capsule   Oral   Take 1 capsule by mouth at bedtime.          BP 122/53  Pulse 83  Temp(Src) 97.7 F (36.5 C) (Oral)  Resp 16  SpO2 95% Physical Exam  Nursing note and vitals reviewed. Constitutional: He is oriented to person, place, and time. He  appears well-developed and well-nourished. No distress.  Patient appears groggy  HENT:  Head: Normocephalic and atraumatic.  Mouth/Throat: Oropharynx is clear and moist. No oropharyngeal exudate.  Dry mucous membranes Halitosis  Eyes: Conjunctivae and EOM are normal. Pupils are equal, round, and reactive to light. Right eye exhibits no discharge. Left eye exhibits no discharge.  Neck: Normal range of motion. Neck supple. No tracheal deviation present.  Negative neck stiffness Negative nuchal rigidity Negative cervical lymphadenopathy  Cardiovascular: Normal rate, regular rhythm and  normal heart sounds.  Exam reveals no friction rub.   No murmur heard. Negative leg swelling pitting edema identified to the lower extremities bilaterally Cap refill less than 3 seconds  Pulmonary/Chest: Effort normal and breath sounds normal. No respiratory distress. He has no wheezes. He has no rales.  Patient able to speak in full sentences without difficulty Negative use of accessory muscles Negative stridor  Abdominal: Soft. Bowel sounds are normal. There is no tenderness. There is no guarding.  Obese  Musculoskeletal: Normal range of motion.  Full ROM to upper and lower extremities without difficulty noted, negative ataxia noted.  Lymphadenopathy:    He has no cervical adenopathy.  Neurological: He is alert and oriented to person, place, and time. No cranial nerve deficit. He exhibits normal muscle tone. Coordination normal.  Gargled speech Cranial nerves III-XII grossly intact Strength 5+/5+ to upper and lower extremities bilaterally with resistance applied, equal distribution noted Patient follows commands well Negative arm drift Fine motor skills intact Negative facial drooping Patient able to bring finger to nose bilaterally without difficulty or ataxia  Skin: Skin is warm and dry. No rash noted. He is not diaphoretic. No erythema.  Psychiatric: He has a normal mood and affect. His behavior is normal. Thought content normal.    ED Course  Procedures (including critical care time)  9:11PM This provider spoke with Onalee Hua, from poison control. As per Onalee Hua, reported that patient needs to be monitored for a least 46 hours after the event-mainly basic labs, EKG and cardiac monitoring the to be performed.  12:46 AM This provider reassess the patient. Discussed labs and imaging in great detail with patient. Patient continues to express discomfort in his throat-continues to have mild muffled voice. CT soft tissue neck with contrast to be ordered to rule out possible retropharyngeal  or peritonsillar abscess.  Results for orders placed during the hospital encounter of 04/20/13  CBC      Result Value Ref Range   WBC 12.9 (*) 4.0 - 10.5 K/uL   RBC 4.58  4.22 - 5.81 MIL/uL   Hemoglobin 14.7  13.0 - 17.0 g/dL   HCT 16.1  09.6 - 04.5 %   MCV 92.4  78.0 - 100.0 fL   MCH 32.1  26.0 - 34.0 pg   MCHC 34.8  30.0 - 36.0 g/dL   RDW 40.9  81.1 - 91.4 %   Platelets 242  150 - 400 K/uL  COMPREHENSIVE METABOLIC PANEL      Result Value Ref Range   Sodium 137  137 - 147 mEq/L   Potassium 3.2 (*) 3.7 - 5.3 mEq/L   Chloride 97  96 - 112 mEq/L   CO2 27  19 - 32 mEq/L   Glucose, Bld 146 (*) 70 - 99 mg/dL   BUN 16  6 - 23 mg/dL   Creatinine, Ser 7.82  0.50 - 1.35 mg/dL   Calcium 9.3  8.4 - 95.6 mg/dL   Total Protein 7.0  6.0 - 8.3  g/dL   Albumin 3.6  3.5 - 5.2 g/dL   AST 17  0 - 37 U/L   ALT 17  0 - 53 U/L   Alkaline Phosphatase 94  39 - 117 U/L   Total Bilirubin 0.4  0.3 - 1.2 mg/dL   GFR calc non Af Amer >90  >90 mL/min   GFR calc Af Amer >90  >90 mL/min  ACETAMINOPHEN LEVEL      Result Value Ref Range   Acetaminophen (Tylenol), Serum <15.0  10 - 30 ug/mL  SALICYLATE LEVEL      Result Value Ref Range   Salicylate Lvl <2.0 (*) 2.8 - 20.0 mg/dL  URINE RAPID DRUG SCREEN (HOSP PERFORMED)      Result Value Ref Range   Opiates NONE DETECTED  NONE DETECTED   Cocaine NONE DETECTED  NONE DETECTED   Benzodiazepines POSITIVE (*) NONE DETECTED   Amphetamines NONE DETECTED  NONE DETECTED   Tetrahydrocannabinol NONE DETECTED  NONE DETECTED   Barbiturates NONE DETECTED  NONE DETECTED  ETHANOL      Result Value Ref Range   Alcohol, Ethyl (B) <11  0 - 11 mg/dL  TROPONIN I      Result Value Ref Range   Troponin I <0.30  <0.30 ng/mL  URINALYSIS, ROUTINE W REFLEX MICROSCOPIC      Result Value Ref Range   Color, Urine YELLOW  YELLOW   APPearance CLOUDY (*) CLEAR   Specific Gravity, Urine 1.014  1.005 - 1.030   pH 6.5  5.0 - 8.0   Glucose, UA NEGATIVE  NEGATIVE mg/dL   Hgb urine  dipstick NEGATIVE  NEGATIVE   Bilirubin Urine NEGATIVE  NEGATIVE   Ketones, ur NEGATIVE  NEGATIVE mg/dL   Protein, ur NEGATIVE  NEGATIVE mg/dL   Urobilinogen, UA 0.2  0.0 - 1.0 mg/dL   Nitrite NEGATIVE  NEGATIVE   Leukocytes, UA NEGATIVE  NEGATIVE    Labs Review Labs Reviewed  CBC - Abnormal; Notable for the following:    WBC 12.9 (*)    All other components within normal limits  COMPREHENSIVE METABOLIC PANEL - Abnormal; Notable for the following:    Potassium 3.2 (*)    Glucose, Bld 146 (*)    All other components within normal limits  SALICYLATE LEVEL - Abnormal; Notable for the following:    Salicylate Lvl <2.0 (*)    All other components within normal limits  URINE RAPID DRUG SCREEN (HOSP PERFORMED) - Abnormal; Notable for the following:    Benzodiazepines POSITIVE (*)    All other components within normal limits  URINALYSIS, ROUTINE W REFLEX MICROSCOPIC - Abnormal; Notable for the following:    APPearance CLOUDY (*)    All other components within normal limits  RAPID STREP SCREEN  ACETAMINOPHEN LEVEL  ETHANOL  TROPONIN I   Imaging Review Ct Head Wo Contrast  04/20/2013   CLINICAL DATA:  Headache and aphasia  EXAM: CT HEAD WITHOUT CONTRAST  TECHNIQUE: Contiguous axial images were obtained from the base of the skull through the vertex without intravenous contrast.  COMPARISON:  None.  FINDINGS: No acute cortical infarct, hemorrhage, or mass lesion ispresent. Ventricles are of normal size. No significant extra-axial fluid collection is present. The paranasal sinuses andmastoid air cells are clear. The osseous skull is intact.  IMPRESSION: 1. No acute intracranial abnormalities.   Electronically Signed   By: Signa Kell M.D.   On: 04/20/2013 21:50     EKG Interpretation   Date/Time:  Friday April 20 2013 18:54:20 EDT Ventricular Rate:  97 PR Interval:  176 QRS Duration: 101 QT Interval:  364 QTC Calculation: 462 R Axis:   -27 Text Interpretation:  Sinus rhythm  Borderline left axis deviation Low  voltage, precordial leads Consider anterior infarct Confirmed by Bebe Shaggy   MD, DONALD ([id number]) on 04/20/2013 7:34:28 PM      MDM   Final diagnoses:  None   Medications  sodium chloride 0.9 % bolus 1,000 mL (0 mLs Intravenous Stopped 04/20/13 2247)  sodium chloride 0.9 % bolus 1,000 mL (1,000 mLs Intravenous New Bag/Given 04/20/13 2348)  iohexol (OMNIPAQUE) 300 MG/ML solution 100 mL (100 mLs Intravenous Contrast Given 04/21/13 0037)   Filed Vitals:   04/20/13 2200 04/20/13 2204 04/20/13 2241 04/20/13 2300  BP:   87/70 122/53  Pulse:   83   Temp: 98.5 F (36.9 C) 97.7 F (36.5 C)    TempSrc: Oral Oral    Resp:   24 16  SpO2:   95%     Patient presenting to the ED with aphasia, slurred speech, lethargy and headache. As per patient, reported that he had a manic episode that last for approximately 3 days has not slept within 3 days. Stated that he was exhausted and wanted to sleep so he took approximately 8-9 tablets of 1 mg Xanax prior to arrival to the ED. This provider reviewed patient's chart. Patient has a strong history of bipolar disorder, depression and mental disorder. Patient takes many antipsychotic medications. Patient had an admission to behavioral health hospital back in January 2014. Alert and oriented. He should found sitting upright in bed sleeping. Patient easily aroused. Patient is able to speak in full sentences without difficulty. Negative stridor. Negative use of accessory muscles. Negative signs of respiratory distress. Heart rate and rhythm normal. Radial and DP pulses 2+ bilaterally. Cap refill less than 3 seconds. Cranial nerves grossly intact. Patient didn't bring finger to nose bilaterally without difficulty or ataxia noted. Equal grip strength identified-strength with equal distribution with resistance applied. Full range of motion to upper and lower extremities identified. Slurred speech, gargled identified. Patient does appear  lethargic. Dry mucous membranes noted. EKG noted normal sinus rhythm with a heart rate of 97 beats per minute, left axis deviation noted low-voltage. Troponin negative elevation. Second troponin negative elevation. CBC noted mild elevated white blood cell count of 12.9. CMP noted mildly low potassium of 3.2-kidneys and liver functioning well. Negative elevation acetaminophen, salicylate, ethanol level. Urine drug screen positive for benzodiazepines. Rapid strep test negative. CT head without contrast negative for acute intracranial abnormalities. CT soft tissue of neck with contrast negative for acute abnormalities-negative mass, loculated fluid collection or adenopathy noted. Layering secretions/debris within the subglottic airway didn't by the patient at risk for aspiration. Doubt stroke. Doubt AMS. Doubt ICH. Doubt SAH. Doubt peritonsillar abscess. Doubt retropharyngeal abscess. Gargled speech has improved. Patient has improved while in the ED setting - less groggy. Discussed case with attending physician who saw and assessed patient - attending believes to be from Xanax medication, does not believe to be stroke - patient was seen again by attending physician who agrees with improvement - as per physician recommended psych consult. Negative focal neurological deficits noted. Patient has been monitored in ED setting for approximately 6 hours-as per recommended by poison control. Patient stable, afebrile. Patient has a strong history of psychological issues-suicidal ideation in the past. Patient given IV fluids positive for leaving good response. Patient is become more alert. Decreased grogginess identified. Patient  seen and assessed by attending physician who agrees with psych evaluation. Patient currently medically cleared. Psych hold orders have been placed. Patient to be evaluated by psychiatry.  Raymon Mutton, PA-C 04/21/13 1159

## 2013-04-20 NOTE — ED Notes (Addendum)
Pt from home reports that he took 8-9 xanax PTA because he "wanted to sleep." Pt denies self-harm. Pt is A&O when awakened. Pt is sleeping, maintaining airway and has even, unlabored resp. Pt is NAD. Pt VSS

## 2013-04-21 ENCOUNTER — Emergency Department (HOSPITAL_COMMUNITY): Payer: Self-pay

## 2013-04-21 LAB — RAPID STREP SCREEN (MED CTR MEBANE ONLY): Streptococcus, Group A Screen (Direct): NEGATIVE

## 2013-04-21 LAB — TROPONIN I: Troponin I: <0.3 ng/mL (ref <0.30)

## 2013-04-21 MED ORDER — IOHEXOL 300 MG/ML  SOLN
100.0000 mL | Freq: Once | INTRAMUSCULAR | Status: AC | PRN
  Administered 2013-04-21: 100 mL via INTRAVENOUS

## 2013-04-21 MED ORDER — POTASSIUM CHLORIDE CRYS ER 20 MEQ PO TBCR
40.0000 meq | EXTENDED_RELEASE_TABLET | Freq: Once | ORAL | Status: AC
  Administered 2013-04-21: 40 meq via ORAL
  Filled 2013-04-21: qty 2

## 2013-04-21 NOTE — Discharge Instructions (Signed)
°Emergency Department Resource Guide °1) Find a Doctor and Pay Out of Pocket °Although you won't have to find out who is covered by your insurance plan, it is a good idea to ask around and get recommendations. You will then need to call the office and see if the doctor you have chosen will accept you as a new patient and what types of options they offer for patients who are self-pay. Some doctors offer discounts or will set up payment plans for their patients who do not have insurance, but you will need to ask so you aren't surprised when you get to your appointment. ° °2) Contact Your Local Health Department °Not all health departments have doctors that can see patients for sick visits, but many do, so it is worth a call to see if yours does. If you don't know where your local health department is, you can check in your phone book. The CDC also has a tool to help you locate your state's health department, and many state websites also have listings of all of their local health departments. ° °3) Find a Walk-in Clinic °If your illness is not likely to be very severe or complicated, you may want to try a walk in clinic. These are popping up all over the country in pharmacies, drugstores, and shopping centers. They're usually staffed by nurse practitioners or physician assistants that have been trained to treat common illnesses and complaints. They're usually fairly quick and inexpensive. However, if you have serious medical issues or chronic medical problems, these are probably not your best option. ° °No Primary Care Doctor: °- Call [facility] at  832-8000 - they can help you locate a primary care doctor that  accepts your insurance, provides certain services, etc. °- Physician Referral Service- 1-800-533-3463 ° °Chronic Pain Problems: °Organization         Address  Phone   Notes  °[facility]  (336) 297-2271 Patients need to be referred by their primary care doctor.  ° °Medication  Assistance: °Organization         Address  Phone   Notes  °[place] [facility] [address] °[place], [place] [address] (336) 641-8030 --Must be a resident of [place] °-- Must have NO insurance coverage whatsoever (no Medicaid/ Medicare, etc.) °-- The pt. MUST have a primary care doctor that directs their care regularly and follows them in the community °  °[facility]  (866) 331-1348   °[employer]  (888) 892-1162   ° °Agencies that provide inexpensive medical care: °Organization         Address  Phone   Notes  °[facility]  (336) 832-8035   °[facility]    (336) 832-7272   °[facility] [address] °[place], [place] [address] (336) 832-4777   °[facility] [address], °[place] (336) 271-4999   °[facility]    (336) 373-0678   °[facility]    (336) 272-1050   °[facility] ° [address], [place] Phone:  (336) 832-4444, Fax:  (336) 832-4440 Hours of Operation:  9 am - 6 pm, M-F.  Also accepts Medicaid/Medicare and self-pay.  °[facility] ° [address], [place] Phone: (336) 832-3150, Fax: (336) 832-3151. Hours of Operation:  8:30 am - 5:30 pm, M-F.  Also accepts Medicaid and self-pay.  °[facility] [place] [address], [place] Phone: (336) 878-6027   °[facility] [address], [place], [place] (336)723-1848, Ext. 123 Mondays & Thursdays: 7-9 AM.  First 15 patients are seen on a first come, first serve basis. °  ° °Medicaid-accepting [place] Providers: ° °Organization         Address  Phone   Notes  °[facility] [address], [place] (336) 641-2100 Also accepts self-pay patients.  °[facility] [address], [place] ° (336) 856-9996   °[facility] [address], [place]  (336) 288-8857   °[facility] [address] [place] [address], [place] (336) 299-7000   °Veita Bland [address], [place]  ° (336) 373-1557 Only accepts [place] Access Medicaid patients after they have their name applied to their card.  ° °Self-Pay (no insurance) in [place]: ° °Organization         Address  Phone   Notes  °Sickle Cell Patients, [facility] [address], [place] (336) 832-1970   °[facility] [address], [place] (336) 832-4400   °[facility] ° [address], [place] (336) 992-4800   °[facility]/Dr. Osei-Bonsu ° [address] [place] [address], [place] or [address], [place] (336) 841-8500 Phone number for both [place] and [place] locations is the same.  °[facility] [address], [place] (336) 299-0000   °[facility] [address] [place] [address], [place] or [address] (336) 852-7530 °(336) 878-2260   °[facility] [address], [place] (336) 350-1642, phone; (336) 294-5005, fax Sees patients 1st and 3rd Saturday of every month.  Must not qualify for public or private insurance (i.e. Medicaid, Medicare, [place] Health Choice, Veterans' Benefits) • Household income should be no more than 200% of the poverty level •The clinic cannot treat you if you are pregnant or think you are pregnant • Sexually transmitted diseases are not treated at the clinic.  ° ° °Dental Care: °Organization         Address  Phone  Notes  °[place] [facility] [address], [place] (336) 641-6152 Accepts children up to age 21 who are enrolled in Medicaid or [place] Health Choice; pregnant women with a Medicaid card; and children who have applied for Medicaid or [place] Health Choice, but were declined, whose parents can pay a reduced fee at time of service.  °[place]  [facility] [place]  [address], [place] (336) 641-7733 Accepts children up to age 21 who are enrolled in Medicaid or [place] Health Choice; pregnant women with a Medicaid card; and children who have applied for Medicaid or  Health Choice, but were declined, whose parents can pay a reduced fee at time of service.  °[facility] ° [address], [place] (336) 641-4533 Patients are seen by appointment only. Walk-ins are not accepted. [facility] will see patients 18 years of age and older. °Monday - Tuesday (8am-5pm) °Most Wednesdays (8:30-5pm) °$30 per visit, cash only  °[facility] ° [address], [place] (336) 641-4533 Patients are seen by appointment only. Walk-ins are not accepted. [facility] will see patients 18 years of age and older. °One   Wednesday Evening (Monthly: Volunteer Based).  $30 per visit, cash only  °[facility]  (919) 537-3737 for adults; Children under age 4, call Graduate Pediatric Dentistry at (919) [phone]. Children aged 4-14, please call (919) 537-3737 to request a pediatric application. ° Dental services are provided in all areas of dental care including fillings, crowns and bridges, complete and partial dentures, implants, gum treatment, root canals, and extractions. Preventive care is also provided. Treatment is provided to both adults and children. °Patients are selected via a lottery and there is often a waiting list. °  °[facility] [address], °[place] ° (336) 763-8833 [email or web address] °  °[facility] [address], [place], [place] (336)723-1848, Ext. 123 Second and Fourth Thursday of each month, opens at 6:30 AM; Clinic ends at 9 AM.  Patients are seen on a first-come first-served basis, and a limited number are seen during each clinic.  ° °[facility] ° [address], [place], [place] (336) 723-7904    Eligibility Requirements °You must have lived in [place], [place], or [place] counties for at least the last three months. °  You cannot be eligible for state or federal sponsored healthcare insurance, including Veterans Administration, Medicaid, or Medicare. °  You generally cannot be eligible for healthcare insurance through your employer.  °  How to apply: °Eligibility screenings are held every Tuesday and Wednesday afternoon from 1:00 pm until 4:00 pm. You do not need an appointment for the interview!  °[facility] [address], [place], [place] 336-631-2330   °[facility] [facility]  336-342-8273   °[facility]  336-703-3100   °[facility]  336-570-6415   ° °Behavioral Health Resources in the Community: °Intensive Outpatient Programs °Organization         Address  Phone  Notes  °[facility] [address], [place], [place] 336-878-6098   °[facility] [address], [place], [place] 336-832-9800   °[facility] [address], [place], [place] ° 336-882-2125   °[facility] [address],  °[place], [place] 1-800-853-5163 or 336-641-4981   °Substance Abuse Resources °Organization         Address  Phone  Notes  °[facility]  336-882-2125   °[facility]  336-784-9470   °[facility]  336-285-9073   °[facility]  336-845-3988   °Residential & Outpatient Substance Abuse Program  1-800-659-3381   °Psychological Services °Organization         Address  Phone  Notes  °[facility]  336- 832-9600   °[facility]  336- 378-7881   °[facility] [address], [place] 1-800-853-5163 or 336-641-4981   ° °Mobile Crisis Teams °Organization         Address  Phone  Notes  °[facility], Mobile Crisis Care Unit  1-877-626-1772   °Assertive °[facility] ° [address]  [place], [place] 336-834-9664   °Sharon DeEsch [address] °[place] 336-554-5454   ° °Self-Help/Support Groups °Organization         Address  Phone             Notes  °[facility] - variety of support groups  336- 373-1402 Call for more information  °Narcotics Anonymous (NA), [facility] [address], °[place]  2 meetings at this location  ° °  Residential Treatment Programs °Organization         Address  Phone  Notes  °[facility] [address],    °[place]  1-866-801-8205   °[facility] ° [address], [place], [place] 704-293-8524   °[facility] [address], [place] 336-845-3988 Admissions: 8am-3pm M-F  °[facility] [address]    °[place], [place] 336-841-1104   °[facility] [address], [place], [place] 336-379-7146   °[facility] [address]  °[place], [place] 336-285-9073   °[facility] Intensive Outpatient [address], [place], [place] 336-852-3033   °[facility] [address]  °[place], [place] 1-877-615-2722 or 336-784-9470   °[facility] [address] [place], [place] 336-227-7417 Accepts Medicaid  °[facility] [address]  °[place] 1-800-659-3381 Substance Abuse/Addiction Treatment  ° °[place] Behavioral Health Resources °Organization         Address  Phone  Notes  °[facility]  (888) 581-9988   °Julie Brannon, PhD [address] [place], [place]   (336) 349-5553 or (336) 951-0000   °[facility]   [address] °[place], [place] (336) 349-4454   °[facility] [address], [place], [place] (336) 342-8316 Insurance/Medicaid/sponsorship through [facility]  °[facility] [address]                                    [place], [place] (336) 342-8316 Therapy/tele-psych/case    °[facility] [address]  ° [place], [place] (336) 349-2233    °Dr. Arfeen  (336) 349-4544   °[facility] [place]  [employer] [place] [facility] 1) [address], [place] °2) [address], [place] °3)  [address], [place] (336) 349-3220 °(336) 342-7768 ° °(336) 342-8140   °[place] Child Abuse [facility] (336) 342-1394 or (336) 342-3537 (After Hours)    ° ° °

## 2013-04-21 NOTE — BH Assessment (Signed)
Tele Assessment Note   Ross Watts is an 48 y.o. male who presents to [facility] after ingesting 7-9 Xanax pills.  Pt denied ingesting pills as suicide attempt. Pt reported he had an argument with roommate and was trying to go to sleep. Pt reported roommate was concerned and transported pt to ED.   Pt denies SI, HI, AVH, and substance use. Pt was Ox4. Pt reported feeling "ok" just being worried about his mom being sick. Pt denies feeling depressed and stated he feels well now that he has gotten some sleep. Pt was able to rate mood at a 7 on 1-10 scale. Pt calm and cooperative.  Pt reported currently receiving medication management. Pt reported [facility] at [facility].   Pt reported inpatient hx in [facility] at [facility].  Pt reported suicide attempt by OD when he was 48 y/o but reported no other attempts.  Pt agreed to follow up with his psychiatrist at upcoming appointment next week. Pt was planning for future and reported wanting to get back home to do his homework for school. Pt reported he also is working a full time job. Pt reported feeling a lot better now that he got some sleep.  Pt agreed to take medication as prescribed. Pt able to contract for safety.   Axis I: Bipolar II (by hx) Axis II: Deferred Axis III:  Past Medical History  Diagnosis Date  . Mental disorder   . Shortness of breath   . Hypertension    Axis IV: problems with primary support group Axis V: 55  Past Medical History:  Past Medical History  Diagnosis Date  . Mental disorder   . Shortness of breath   . Hypertension     History reviewed. No pertinent past surgical history.  Family History: No family history on file.  Social History:  reports that he has been smoking Cigarettes.  He has a 62.5 pack-year smoking history. He does not have any smokeless tobacco history on file. He reports that he does not drink alcohol or use illicit drugs.  Additional Social History:  Alcohol / Drug Use Pain  Medications: none reported Prescriptions: Xanax, Hydrocodine, Lisnopridine, Olanzapin-Fluxoetin Over the Counter: none reported History of alcohol / drug use?: No history of alcohol / drug abuse  CIWA: CIWA-Ar BP: 179/78 mmHg Pulse Rate: 75 COWS:    Allergies:  Allergies  Allergen Reactions  . Sulfa Antibiotics Itching, Nausea And Vomiting and Rash    Home Medications:  (Not in a hospital admission)  OB/GYN Status:  No LMP for male patient.  General Assessment Data Location of Assessment: [facility] Is this a Tele or Face-to-Face Assessment?: Tele Assessment Is this an Initial Assessment or a Re-assessment for this encounter?: Initial Assessment Living Arrangements: Non-relatives/Friends Can pt return to current living arrangement?: Yes Admission Status: Voluntary Is patient capable of signing voluntary admission?: Yes Transfer from: Acute Hospital Referral Source: Self/Family/Friend  Medical Screening Exam [facility] Walk-in ONLY) Medical Exam completed:  (NA)  [facility] Crisis Care Plan Living Arrangements: Non-relatives/Friends Name of Psychiatrist:  (Dr. Charm Barges- in [place], [place]) Name of Therapist:  (none reported)     Risk to self Suicidal Ideation: No Suicidal Intent: No Is patient at risk for suicide?: No Suicidal Plan?: No Access to Means: No What has been your use of drugs/alcohol within the last 12 months?:  (none reported) Previous Attempts/Gestures: Yes How many times?: 1 Other Self Harm Risks:  (none reported) Triggers for Past Attempts: Other (Comment) (manic  state) Intentional Self Injurious Behavior: None Family Suicide History: Unknown Recent stressful life event(s): Conflict (Comment) (Pt reported conflict with roommate. ) Persecutory voices/beliefs?: No Depression: Yes Depression Symptoms: Feeling angry/irritable;Fatigue Substance abuse history and/or treatment for substance abuse?: No Suicide prevention information given to non-admitted patients:  Yes  Risk to Others Homicidal Ideation: No Thoughts of Harm to Others: No Current Homicidal Intent: No Current Homicidal Plan: No Access to Homicidal Means: No Identified Victim:  (none reported) History of harm to others?: No Assessment of Violence: In past 6-12 months Violent Behavior Description:  (Pt was calm and cooperative. ) Does patient have access to weapons?: No Criminal Charges Pending?: No Does patient have a court date: No  Psychosis Hallucinations: None noted Delusions: None noted  Mental Status Report Appear/Hygiene: Other (Comment) (Pt wearing paper scrubs. ) Eye Contact: Fair Motor Activity: Unremarkable Speech: Logical/coherent;Slurred Level of Consciousness: Alert Mood:  (Euthymic) Affect: Other (Comment) (Euthymic) Anxiety Level: Minimal Thought Processes: Coherent;Relevant Judgement: Unimpaired Orientation: Person;Place;Time;Situation Obsessive Compulsive Thoughts/Behaviors: None  Cognitive Functioning Concentration: Normal Memory: Recent Intact;Remote Intact IQ: Average Insight: Fair Impulse Control: Fair Appetite: Fair Weight Loss:  (unk) Weight Gain:  (unk) Sleep: Decreased Total Hours of Sleep:  (unk) Vegetative Symptoms: None  ADLScreening [facility]) Patient's cognitive ability adequate to safely complete daily activities?: Yes Patient able to express need for assistance with ADLs?: Yes Independently performs ADLs?: Yes (appropriate for developmental age)  Prior Inpatient Therapy Prior Inpatient Therapy: Yes Prior Therapy Dates:  (01/2012, 1998) Prior Therapy Facilty/Provider(s):  [facility] Reason for Treatment:  (aggressive behaviors and SI)  Prior Outpatient Therapy Prior Outpatient Therapy: Yes Prior Therapy Dates:  (2015) Prior Therapy Facilty/Provider(s):  (med mgmt- Dr. [facility]arm [place]) Reason for Treatment:  (med mgmt)  ADL Screening (condition at time of admission) Patient's cognitive ability adequate to safely  complete daily activities?: Yes Is the patient deaf or have difficulty hearing?: No Does the patient have difficulty seeing, even when wearing glasses/contacts?: No Does the patient have difficulty concentrating, remembering, or making decisions?: No Patient able to express need for assistance with ADLs?: Yes Does the patient have difficulty dressing or bathing?: No Independently performs ADLs?: Yes (appropriate for developmental age)       Abuse/Neglect Assessment (Assessment to be complete while patient is alone) Physical Abuse: Denies Verbal Abuse: Denies Sexual Abuse: Denies Exploitation of patient/patient's resources: Denies Self-Neglect: Denies Values / Beliefs Cultural Requests During Hospitalization: None Spiritual Requests During Hospitalization: None   Advance Directives (For Healthcare) Advance Directive: Patient does not have advance directive    Additional Information 1:1 In Past 12 Months?: No CIRT Risk: No Elopement Risk: No Does patient have medical clearance?: Yes     Disposition: Clinician consulted with [name] NP who states Pt does not meet criteria for inpatient admission and recommends pt follow up with out-patient provider and sign No harm Contract.  Yaakov Guthrieelilah Stewart, MSW, LCSW Triage Specialist [phone] Disposition Initial Assessment Completed for this Encounter: Yes Disposition of Patient: Outpatient treatment Type of outpatient treatment: Adult (To follow up with current outpatient provider.)  Alric Quanelilah R Stewart 04/21/2013 5:45 AM

## 2013-04-21 NOTE — BH Assessment (Signed)
Clinician contacted Dr. Patria Maneampos to gather report on pt.  Dr. Patria Maneampos reported that doctor who followed up with pt had left. Clinician requested Art, RN to set up tele-assessment machine. Assessment to be initiated. Ross Watts, MSW, LCSW Triage Specialist [phone]

## 2013-04-21 NOTE — ED Provider Notes (Signed)
5:19 AM Patient has been seen and evaluated by the psychiatric team.  Please see consult note for complete details.  His felt by the psychiatric consultation team that the patient is not a threat to himself or to others.  Outpatient resources given.  Lyanne CoKevin M Denica Web, MD 04/21/13 [phone]

## 2013-04-21 NOTE — BH Assessment (Signed)
Clinician consulted with [name] NP who states Pt does not meet criteria for inpatient admission and recommends pt follow up with out-patient provider. Pt is able to contract for safety. Clinician provided recommendation to Dr. Patria Maneampos. Clinician faxed No Harm Contract to [place], RN to have pt sign upon discharge.  Ross Watts Ross Watts, MSW, LCSW Triage Specialist [phone]

## 2013-04-22 LAB — CULTURE, GROUP A STREP

## 2013-04-22 NOTE — ED Provider Notes (Signed)
Medical screening examination/treatment/procedure(s) were conducted as a shared visit with non-physician practitioner(s) and myself.  I personally evaluated the patient during the encounter.   EKG Interpretation   Date/Time:  Friday April 20 2013 18:54:20 EDT Ventricular Rate:  97 PR Interval:  176 QRS Duration: 101 QT Interval:  364 QTC Calculation: 462 R Axis:   -27 Text Interpretation:  Sinus rhythm Borderline left axis deviation Low  voltage, precordial leads Consider anterior infarct Confirmed by Bebe ShaggyWICKLINE   MD, Kaynen Minner ([id number]) on 04/20/2013 7:34:28 PM        Joya Gaskinsonald W Gust Eugene, MD 04/22/13 1351

## 2013-07-17 ENCOUNTER — Encounter (HOSPITAL_COMMUNITY): Payer: Self-pay | Admitting: Emergency Medicine

## 2013-07-17 ENCOUNTER — Emergency Department (HOSPITAL_COMMUNITY)
Admission: EM | Admit: 2013-07-17 | Discharge: 2013-07-17 | Disposition: A | Discharge status: Home / Self Care | Payer: Self-pay | Attending: Emergency Medicine | Admitting: Emergency Medicine

## 2013-07-17 ENCOUNTER — Emergency Department (HOSPITAL_COMMUNITY): Payer: Self-pay

## 2013-07-17 DIAGNOSIS — F17200 Nicotine dependence, unspecified, uncomplicated: Secondary | ICD-10-CM | POA: Insufficient documentation

## 2013-07-17 DIAGNOSIS — S46909A Unspecified injury of unspecified muscle, fascia and tendon at shoulder and upper arm level, unspecified arm, initial encounter: Secondary | ICD-10-CM | POA: Insufficient documentation

## 2013-07-17 DIAGNOSIS — R296 Repeated falls: Secondary | ICD-10-CM | POA: Insufficient documentation

## 2013-07-17 DIAGNOSIS — Y929 Unspecified place or not applicable: Secondary | ICD-10-CM | POA: Insufficient documentation

## 2013-07-17 DIAGNOSIS — Z79899 Other long term (current) drug therapy: Secondary | ICD-10-CM | POA: Insufficient documentation

## 2013-07-17 DIAGNOSIS — M25512 Pain in left shoulder: Secondary | ICD-10-CM

## 2013-07-17 DIAGNOSIS — F489 Nonpsychotic mental disorder, unspecified: Secondary | ICD-10-CM | POA: Insufficient documentation

## 2013-07-17 DIAGNOSIS — Y9389 Activity, other specified: Secondary | ICD-10-CM | POA: Insufficient documentation

## 2013-07-17 DIAGNOSIS — S4980XA Other specified injuries of shoulder and upper arm, unspecified arm, initial encounter: Secondary | ICD-10-CM | POA: Insufficient documentation

## 2013-07-17 DIAGNOSIS — I10 Essential (primary) hypertension: Secondary | ICD-10-CM | POA: Insufficient documentation

## 2013-07-17 MED ORDER — MELOXICAM 7.5 MG PO TABS: 7.5000 mg | ORAL_TABLET | Freq: Every day | ORAL | Status: AC

## 2013-07-17 NOTE — Progress Notes (Signed)
[facility] CL did not get to see patient but will be sending information about [facility] Card to help patient establish primary care, using the address provided.  °

## 2013-07-17 NOTE — ED Provider Notes (Signed)
Medical screening examination/treatment/procedure(s) were performed by non-physician practitioner and as supervising physician I was immediately available for consultation/collaboration.   EKG Interpretation None        Dagmar HaitWilliam Rachyl Wuebker, MD 07/17/13 1546

## 2013-07-17 NOTE — ED Notes (Addendum)
Pt states that he fell off a porch (in suicide attempt) a month ago but wasn't seen for shoulder injury.  Pt states last night when trying to lay on his left side left shoulder pain started back. According to family member pt started a new anti-pschotic medication yesterday and was loopy and fell last night.

## 2013-07-17 NOTE — ED Provider Notes (Signed)
CSN: [id number]     Arrival date & time 07/17/13  1205 History  This chart was scribed for non-physician practitioner Junious SilkHannah Hughie Melroy, working with Dagmar HaitWilliam Blair Walden, MD by Carl Bestelina Holson, ED Scribe. This patient was seen in room WTR5/WTR5 and the patient's care was started at 1:24 PM.    Chief Complaint  Patient presents with  . Shoulder Pain   Patient is a 48 y.o. male presenting with shoulder pain. The history is provided by the patient. No language interpreter was used.  Shoulder Pain Pertinent negatives include no headaches.  Shoulder Pain Associated symptoms include arthralgias. Pertinent negatives include no headaches.   HPI Comments: Mujahid Aurelio BrashF Zerbe is a [age] y.o. male who presents to the Emergency Department complaining of constant shoulder pain radiating to his left elbow that started last night after the patient fell after he got up too quickly.  He denies any head injury, other injuries, or LOC at the time of the fall.  The patient denies taking any medication for this pain.  He states that he fell off a porch a month ago and hurt his left shoulder but denies being seen for his symptoms because they eventually resolved themselves.  The patient states that he just recently started taking Iloperidone and has not been taking Xanax or Lisinopril since he started his new medication.  The patient states that his psychiatrist is Dr. Carmon Ginsberg'Neil. He denies SI/HI currently.   Past Medical History  Diagnosis Date  . Mental disorder   . Shortness of breath   . Hypertension    History reviewed. No pertinent past surgical history. No family history on file. History  Substance Use Topics  . Smoking status: Current Every Day Smoker -- 2.50 packs/day for 25 years    Types: Cigarettes  . Smokeless tobacco: Not on file  . Alcohol Use: No    Review of Systems  Musculoskeletal: Positive for arthralgias.  Neurological: Negative for dizziness, syncope, light-headedness and headaches.   Psychiatric/Behavioral: Negative for confusion and decreased concentration.  All other systems reviewed and are negative.     Allergies  Sulfa antibiotics  Home Medications   Prior to Admission medications   Medication Sig Start Date End Date Taking? Authorizing Provider  ALPRAZolam Prudy Feeler(XANAX) 1 MG tablet Take 1 mg by mouth 2 (two) times daily as needed for anxiety.    Historical Provider, MD  lisinopril (PRINIVIL,ZESTRIL) 20 MG tablet Take 20 mg by mouth daily.    Historical Provider, MD   Triage Vitals: BP 116/64  Pulse 103  Temp(Src) 97.8 F (36.6 C) (Oral)  Resp 20  SpO2 96%  Physical Exam  Nursing note and vitals reviewed. Constitutional: He is oriented to person, place, and time. He appears well-developed and well-nourished.  HENT:  Head: Normocephalic and atraumatic.  Mouth/Throat: Oropharynx is clear and moist. No oropharyngeal exudate.  Eyes: Conjunctivae and EOM are normal. Pupils are equal, round, and reactive to light. No scleral icterus.  Neck: Normal range of motion.  Cardiovascular: Normal rate, regular rhythm and normal heart sounds.  Exam reveals no gallop and no friction rub.   No murmur heard. Pulmonary/Chest: Effort normal and breath sounds normal. No respiratory distress. He has no wheezes. He has no rales.  Musculoskeletal: Normal range of motion.  Tender to palpation over left AC joint.  No deformities.  Range of motion limited due to pain.  Grip strength 5/5 on left.  2+ radial pulses on left.  Sensation intact.  Compartment soft.   Neurological: He  is alert and oriented to person, place, and time.  Skin: Skin is warm and dry.  Psychiatric: He has a normal mood and affect. His behavior is normal.    ED Course  Procedures (including critical care time)  DIAGNOSTIC STUDIES: Oxygen Saturation is 96% on room air, adequate by my interpretation.    COORDINATION OF CARE: 1:26 PM- Discussed obtaining an x-ray of the patient's left shoulder and the  patient agreed to the treatment plan.    Labs Review Labs Reviewed - No data to display  Imaging Review Dg Shoulder Left  07/17/2013   CLINICAL DATA:  Larey SeatFell 2 months ago.  Pain top of shoulder.  EXAM: LEFT SHOULDER - 2+ VIEW  COMPARISON:  None.  FINDINGS: No fracture or dislocation.  Acromioclavicular joint degenerative changes.  IMPRESSION: No fracture or dislocation.  Acromioclavicular joint degenerative changes.   Electronically Signed   By: Bridgett LarssonSteve  Olson M.D.   On: 07/17/2013 14:18     EKG Interpretation None      MDM   Final diagnoses:  Left shoulder pain    Patient presents to ED with left shoulder pain after a fall last night. No other injuries. TTP over left AC joint. Neurovascularly intact. Compartment soft. Discussed results with patient. He was given orthopedic follow up and encouraged to do ROM exercises. Patient discharged home with mobic. Return instructions given. Vital signs stable for discharge. Patient / Family / Caregiver informed of clinical course, understand medical decision-making process, and agree with plan.   I personally performed the services described in this documentation, which was scribed in my presence. The recorded information has been reviewed and is accurate.    Mora BellmanHannah S Cuong Moorman, PA-C 07/17/13 1544

## 2013-07-17 NOTE — Discharge Instructions (Signed)
Shoulder Pain The shoulder is the joint that connects your arm to your body. Muscles and band-like tissues that connect bones to muscles (tendons) hold the joint together. Shoulder pain is felt if an injury or medical problem affects one or more parts of the shoulder. HOME CARE   Put ice on the sore area.  Put ice in a plastic bag.  Place a towel between your skin and the bag.  Leave the ice on for 15-20 minutes, 03-04 times a day for the first 2 days.  Stop using cold packs if they do not help with the pain.  If you were given something to keep your shoulder from moving (sling, shoulder immobilizer), wear it as told. Only take it off to shower or bathe.  Move your arm as little as possible, but keep your hand moving to prevent puffiness (swelling).  Squeeze a soft ball or foam pad as much as possible to help prevent swelling.  Take medicine as told by your doctor. GET HELP RIGHT AWAY IF:   Your arm, hand, or fingers are numb or tingling.  Your arm, hand, or fingers are puffy (swollen), painful, or turn white or blue.  You have more pain.  You have progressing new pain in your arm, hand, or fingers.  Your hand or fingers get cold.  Your medicine does not help lessen your pain. MAKE SURE YOU:   Understand these instructions.  Will watch your condition.  Will get help right away if you are not doing well or get worse. Document Released: 06/16/2007 Document Revised: 09/22/2011 Document Reviewed: 07/12/2011 [facility] Patient Information 2015 [place], [place]. This information is not intended to replace advice given to you by your health care provider. Make sure you discuss any questions you have with your health care provider.  Shoulder Range of Motion Exercises The shoulder is the most flexible joint in the human body. Because of this it is also the most unstable joint in the body. All ages can develop shoulder problems. Early treatment of problems is necessary for a good  outcome. People react to shoulder pain by decreasing the movement of the joint. After a brief period of time, the shoulder can become "frozen". This is an almost complete loss of the ability to move the damaged shoulder. Following injuries your caregivers can give you instructions on exercises to keep your range of motion (ability to move your shoulder freely), or regain it if it has been lost.  EXERCISES EXERCISES TO MAINTAIN THE MOBILITY OF YOUR SHOULDER: Codman's Exercise or Pendulum Exercise  This exercise may be performed in a prone (face-down) lying position or standing while leaning on a chair with the opposite arm. Its purpose is to relax the muscles in your shoulder and slowly but surely increase the range of motion and to relieve pain.  Lie on your stomach close to the side edge of the bed. Let your weak arm hang over the edge of the bed. Relax your shoulder, arm and hand. Let your shoulder blade relax and drop down.  Slowly and gently swing your arm forward and back. Do not use your neck muscles; relax them. It might be easier to have someone else gently start swinging your arm.  As pain decreases, increase your swing. To start, arm swing should begin at 15 degree angles. In time and as pain lessens, move to 30-45 degree angles. Start with swinging for about 15 seconds, and work towards swinging for 3 to 5 minutes.  This exercise may also  be performed in a standing/bent over position.  Stand and hold onto a sturdy chair with your good arm. Bend forward at the waist and bend your knees slightly to help protect your back. Relax your weak arm, let it hang limp. Relax your shoulder blade and let it drop.  Keep your shoulder relaxed and use body motion to swing your arm in small circles.  Stand up tall and relax.  Repeat motion and change direction of circles.  Start with swinging for about 30 seconds, and work towards swinging for 3 to 5 minutes. STRETCHING EXERCISES:  Lift your arm  out in front of you with the elbow bent at 90 degrees. Using your other arm gently pull the elbow forward and across your body.  Bend one arm behind you with the palm facing outward. Using the other arm, hold a towel or rope and reach this arm up above your head, then bend it at the elbow to move your wrist to behind your neck. Grab the free end of the towel with the hand behind your back. Gently pull the towel up with the hand behind your neck, gradually increasing the pull on the hand behind the small of your back. Then, gradually pull down with the hand behind the small of your back. This will pull the hand and arm behind your neck further. Both shoulders will have an increased range of motion with repetition of this exercise. STRENGTHENING EXERCISES:  Standing with your arm at your side and straight out from your shoulder with the elbow bent at 90 degrees, hold onto a small weight and slowly raise your hand so it points straight up in the air. Repeat this five times to begin with, and gradually increase to ten times. Do this four times per day. As you grow stronger you can gradually increase the weight.  Repeat the above exercise, only this time using an elastic band. Start with your hand up in the air and pull down until your hand is by your side. As you grow stronger, gradually increase the amount you pull by increasing the number or size of the elastic bands. Use the same amount of repetitions.  Standing with your hand at your side and holding onto a weight, gradually lift the hand in front of you until it is over your head. Do the same also with the hand remaining at your side and lift the hand away from your body until it is again over your head. Repeat this five times to begin with, and gradually increase to ten times. Do this four times per day. As you grow stronger you can gradually increase the weight. Document Released: 09/26/2002 Document Revised: 01/02/2013 Document Reviewed:  12/28/2004 [facility] Patient Information 2015 [place], [place]LL[place]. This information is not intended to replace advice given to you by your health care provider. Make sure you discuss any questions you have with your health care provider.

## 2014-07-09 ENCOUNTER — Emergency Department (HOSPITAL_COMMUNITY): Payer: Medicaid Other

## 2014-07-09 ENCOUNTER — Other Ambulatory Visit: Payer: Self-pay

## 2014-07-09 ENCOUNTER — Inpatient Hospital Stay (HOSPITAL_COMMUNITY)
Admission: EM | Admit: 2014-07-09 | Discharge: 2014-07-12 | DRG: 296 | Disposition: E | Payer: Medicaid Other | Attending: Pulmonary Disease | Admitting: Pulmonary Disease

## 2014-07-09 ENCOUNTER — Inpatient Hospital Stay (HOSPITAL_COMMUNITY): Payer: Medicaid Other

## 2014-07-09 DIAGNOSIS — J9601 Acute respiratory failure with hypoxia: Secondary | ICD-10-CM

## 2014-07-09 DIAGNOSIS — Z515 Encounter for palliative care: Secondary | ICD-10-CM | POA: Diagnosis not present

## 2014-07-09 DIAGNOSIS — R739 Hyperglycemia, unspecified: Secondary | ICD-10-CM | POA: Diagnosis present

## 2014-07-09 DIAGNOSIS — R570 Cardiogenic shock: Secondary | ICD-10-CM | POA: Diagnosis present

## 2014-07-09 DIAGNOSIS — G936 Cerebral edema: Secondary | ICD-10-CM | POA: Diagnosis present

## 2014-07-09 DIAGNOSIS — I469 Cardiac arrest, cause unspecified: Principal | ICD-10-CM | POA: Diagnosis present

## 2014-07-09 DIAGNOSIS — R40243 Glasgow coma scale score 3-8: Secondary | ICD-10-CM | POA: Diagnosis present

## 2014-07-09 DIAGNOSIS — E876 Hypokalemia: Secondary | ICD-10-CM | POA: Diagnosis present

## 2014-07-09 DIAGNOSIS — Z6841 Body Mass Index (BMI) 40.0 and over, adult: Secondary | ICD-10-CM

## 2014-07-09 DIAGNOSIS — R4020 Unspecified coma: Secondary | ICD-10-CM | POA: Diagnosis present

## 2014-07-09 DIAGNOSIS — F319 Bipolar disorder, unspecified: Secondary | ICD-10-CM | POA: Diagnosis present

## 2014-07-09 DIAGNOSIS — J9600 Acute respiratory failure, unspecified whether with hypoxia or hypercapnia: Secondary | ICD-10-CM | POA: Diagnosis present

## 2014-07-09 DIAGNOSIS — N179 Acute kidney failure, unspecified: Secondary | ICD-10-CM | POA: Diagnosis present

## 2014-07-09 DIAGNOSIS — Z66 Do not resuscitate: Secondary | ICD-10-CM | POA: Diagnosis not present

## 2014-07-09 DIAGNOSIS — G931 Anoxic brain damage, not elsewhere classified: Secondary | ICD-10-CM | POA: Diagnosis present

## 2014-07-09 DIAGNOSIS — G9340 Encephalopathy, unspecified: Secondary | ICD-10-CM

## 2014-07-09 DIAGNOSIS — I10 Essential (primary) hypertension: Secondary | ICD-10-CM | POA: Diagnosis present

## 2014-07-09 DIAGNOSIS — E872 Acidosis: Secondary | ICD-10-CM | POA: Diagnosis present

## 2014-07-09 LAB — ABO/RH: ABO/RH(D): O POS

## 2014-07-09 LAB — URINALYSIS, ROUTINE W REFLEX MICROSCOPIC
Bilirubin Urine: NEGATIVE
Glucose, UA: NEGATIVE mg/dL
HGB URINE DIPSTICK: NEGATIVE
Ketones, ur: NEGATIVE mg/dL
Leukocytes, UA: NEGATIVE
Nitrite: NEGATIVE
PROTEIN: 100 mg/dL — AB
Specific Gravity, Urine: 1.026 (ref 1.005–1.030)
Urobilinogen, UA: 0.2 mg/dL (ref 0.0–1.0)
pH: 5.5 (ref 5.0–8.0)

## 2014-07-09 LAB — I-STAT CHEM 8, ED
BUN: 14 mg/dL (ref 6–20)
Calcium, Ion: 1.1 mmol/L — ABNORMAL LOW (ref 1.12–1.23)
Chloride: 107 mmol/L (ref 101–111)
Creatinine, Ser: 1.5 mg/dL — ABNORMAL HIGH (ref 0.61–1.24)
Glucose, Bld: 346 mg/dL — ABNORMAL HIGH (ref 65–99)
HEMATOCRIT: 39 % (ref 39.0–52.0)
Hemoglobin: 13.3 g/dL (ref 13.0–17.0)
Potassium: 3.6 mmol/L (ref 3.5–5.1)
Sodium: 142 mmol/L (ref 135–145)
TCO2: 12 mmol/L (ref 0–100)

## 2014-07-09 LAB — HEPATIC FUNCTION PANEL
ALBUMIN: 2.8 g/dL — AB (ref 3.5–5.0)
ALT: 82 U/L — ABNORMAL HIGH (ref 17–63)
AST: 105 U/L — AB (ref 15–41)
Alkaline Phosphatase: 88 U/L (ref 38–126)
BILIRUBIN TOTAL: 0.4 mg/dL (ref 0.3–1.2)
Bilirubin, Direct: 0.2 mg/dL (ref 0.1–0.5)
Indirect Bilirubin: 0.2 mg/dL — ABNORMAL LOW (ref 0.3–0.9)
Total Protein: 5.7 g/dL — ABNORMAL LOW (ref 6.5–8.1)

## 2014-07-09 LAB — BASIC METABOLIC PANEL
Anion gap: 26 — ABNORMAL HIGH (ref 5–15)
BUN: 11 mg/dL (ref 6–20)
CHLORIDE: 105 mmol/L (ref 101–111)
CO2: 10 mmol/L — ABNORMAL LOW (ref 22–32)
CREATININE: 1.9 mg/dL — AB (ref 0.61–1.24)
Calcium: 8.5 mg/dL — ABNORMAL LOW (ref 8.9–10.3)
GFR calc Af Amer: 47 mL/min — ABNORMAL LOW (ref >60)
GFR calc non Af Amer: 40 mL/min — ABNORMAL LOW (ref >60)
GLUCOSE: 364 mg/dL — AB (ref 65–99)
Potassium: 3.6 mmol/L (ref 3.5–5.1)
SODIUM: 141 mmol/L (ref 135–145)

## 2014-07-09 LAB — I-STAT ARTERIAL BLOOD GAS, ED
ACID-BASE DEFICIT: 18 mmol/L — AB (ref 0.0–2.0)
BICARBONATE: 15.4 meq/L — AB (ref 20.0–24.0)
O2 Saturation: 95 %
PCO2 ART: 78.2 mmHg — AB (ref 35.0–45.0)
PO2 ART: 131 mmHg — AB (ref 80.0–100.0)
Patient temperature: 98.6
TCO2: 18 mmol/L (ref 0–100)
pH, Arterial: 6.901 — CL (ref 7.350–7.450)

## 2014-07-09 LAB — CBC WITH DIFFERENTIAL/PLATELET
BASOS PCT: 0 % (ref 0–1)
Basophils Absolute: 0 10*3/uL (ref 0.0–0.1)
EOS ABS: 0.1 10*3/uL (ref 0.0–0.7)
EOS PCT: 1 % (ref 0–5)
HCT: 38.8 % — ABNORMAL LOW (ref 39.0–52.0)
Hemoglobin: 12.1 g/dL — ABNORMAL LOW (ref 13.0–17.0)
LYMPHS PCT: 40 % (ref 12–46)
Lymphs Abs: 5.2 10*3/uL — ABNORMAL HIGH (ref 0.7–4.0)
MCH: 30.7 pg (ref 26.0–34.0)
MCHC: 31.2 g/dL (ref 30.0–36.0)
MCV: 98.5 fL (ref 78.0–100.0)
Monocytes Absolute: 0.8 10*3/uL (ref 0.1–1.0)
Monocytes Relative: 6 % (ref 3–12)
NEUTROS PCT: 53 % (ref 43–77)
Neutro Abs: 6.9 10*3/uL (ref 1.7–7.7)
PLATELETS: 202 10*3/uL (ref 150–400)
RBC: 3.94 MIL/uL — AB (ref 4.22–5.81)
RDW: 12.8 % (ref 11.5–15.5)
WBC: 13 10*3/uL — ABNORMAL HIGH (ref 4.0–10.5)

## 2014-07-09 LAB — I-STAT TROPONIN, ED: Troponin i, poc: 0.16 ng/mL (ref 0.00–0.08)

## 2014-07-09 LAB — RAPID URINE DRUG SCREEN, HOSP PERFORMED
AMPHETAMINES: NOT DETECTED
BENZODIAZEPINES: NOT DETECTED
Barbiturates: NOT DETECTED
Cocaine: NOT DETECTED
OPIATES: NOT DETECTED
Tetrahydrocannabinol: NOT DETECTED

## 2014-07-09 LAB — PROTIME-INR
INR: 1.46 (ref 0.00–1.49)
PROTHROMBIN TIME: 17.8 s — AB (ref 11.6–15.2)

## 2014-07-09 LAB — GLUCOSE, CAPILLARY: Glucose-Capillary: 298 mg/dL — ABNORMAL HIGH (ref 65–99)

## 2014-07-09 LAB — TYPE AND SCREEN
ABO/RH(D): O POS
ANTIBODY SCREEN: NEGATIVE

## 2014-07-09 LAB — URINE MICROSCOPIC-ADD ON

## 2014-07-09 LAB — CG4 I-STAT (LACTIC ACID): Lactic Acid, Venous: 5.46 mmol/L (ref 0.5–2.0)

## 2014-07-09 LAB — I-STAT CG4 LACTIC ACID, ED: Lactic Acid, Venous: >17 mmol/L (ref 0.5–2.0)

## 2014-07-09 LAB — CARBOXYHEMOGLOBIN
Carboxyhemoglobin: 1.9 % — ABNORMAL HIGH (ref 0.5–1.5)
METHEMOGLOBIN: 1 % (ref 0.0–1.5)
O2 Saturation: 98.3 %
Total hemoglobin: 12.7 g/dL — ABNORMAL LOW (ref 13.5–18.0)

## 2014-07-09 LAB — PHOSPHORUS: Phosphorus: 6.2 mg/dL — ABNORMAL HIGH (ref 2.5–4.6)

## 2014-07-09 LAB — BRAIN NATRIURETIC PEPTIDE: B Natriuretic Peptide: 203.8 pg/mL — ABNORMAL HIGH (ref 0.0–100.0)

## 2014-07-09 LAB — CORTISOL: Cortisol, Plasma: 17.9 ug/dL

## 2014-07-09 LAB — LACTIC ACID, PLASMA: Lactic Acid, Venous: 11 mmol/L (ref 0.5–2.0)

## 2014-07-09 LAB — MAGNESIUM: Magnesium: 2.2 mg/dL (ref 1.7–2.4)

## 2014-07-09 LAB — TSH: TSH: 6.718 u[IU]/mL — ABNORMAL HIGH (ref 0.350–4.500)

## 2014-07-09 LAB — PROCALCITONIN: Procalcitonin: <0.1 ng/mL

## 2014-07-09 LAB — TROPONIN I: TROPONIN I: 1.87 ng/mL — AB (ref <0.031)

## 2014-07-09 MED ORDER — NOREPINEPHRINE BITARTRATE 1 MG/ML IV SOLN
0.0000 ug/min | INTRAVENOUS | Status: DC
  Administered 2014-07-09: 20 ug/min via INTRAVENOUS
  Administered 2014-07-09 – 2014-07-10 (×2): 10 ug/min via INTRAVENOUS
  Filled 2014-07-09 (×4): qty 4

## 2014-07-09 MED ORDER — IPRATROPIUM-ALBUTEROL 0.5-2.5 (3) MG/3ML IN SOLN
3.0000 mL | Freq: Four times a day (QID) | RESPIRATORY_TRACT | Status: DC
  Administered 2014-07-10 (×2): 3 mL via RESPIRATORY_TRACT
  Filled 2014-07-09 (×2): qty 3

## 2014-07-09 MED ORDER — EPINEPHRINE HCL 0.1 MG/ML IJ SOSY
PREFILLED_SYRINGE | INTRAMUSCULAR | Status: AC | PRN
  Administered 2014-07-09 (×4): 1 mg via INTRAVENOUS

## 2014-07-09 MED ORDER — SODIUM BICARBONATE 8.4 % IV SOLN
INTRAVENOUS | Status: DC
  Administered 2014-07-09 – 2014-07-10 (×2): via INTRAVENOUS
  Filled 2014-07-09 (×5): qty 150

## 2014-07-09 MED ORDER — SODIUM CHLORIDE 0.9 % IV BOLUS (SEPSIS)
2000.0000 mL | Freq: Once | INTRAVENOUS | Status: AC
  Administered 2014-07-09: 2000 mL via INTRAVENOUS

## 2014-07-09 MED ORDER — HEPARIN BOLUS VIA INFUSION
5000.0000 [IU] | Freq: Once | INTRAVENOUS | Status: AC
  Administered 2014-07-09: 5000 [IU] via INTRAVENOUS
  Filled 2014-07-09: qty 5000

## 2014-07-09 MED ORDER — HEPARIN (PORCINE) IN NACL 100-0.45 UNIT/ML-% IJ SOLN
1900.0000 [IU]/h | INTRAMUSCULAR | Status: DC
  Administered 2014-07-09: 1900 [IU]/h via INTRAVENOUS
  Filled 2014-07-09 (×2): qty 250

## 2014-07-09 MED ORDER — VASOPRESSIN 20 UNIT/ML IV SOLN
0.0300 [IU]/min | INTRAVENOUS | Status: DC
  Administered 2014-07-09: 0.03 [IU]/min via INTRAVENOUS
  Filled 2014-07-09 (×2): qty 2

## 2014-07-09 MED ORDER — FAMOTIDINE IN NACL 20-0.9 MG/50ML-% IV SOLN
20.0000 mg | Freq: Two times a day (BID) | INTRAVENOUS | Status: DC
  Administered 2014-07-09 – 2014-07-10 (×2): 20 mg via INTRAVENOUS
  Filled 2014-07-09 (×4): qty 50

## 2014-07-09 MED ORDER — SODIUM CHLORIDE 0.9 % IV SOLN: 250.0000 mL | INTRAVENOUS | Status: DC | PRN

## 2014-07-09 MED ORDER — SODIUM CHLORIDE 0.9 % IV SOLN
INTRAVENOUS | Status: DC
  Administered 2014-07-09: 2.4 [IU]/h via INTRAVENOUS
  Administered 2014-07-10: 1.3 [IU]/h via INTRAVENOUS
  Filled 2014-07-09: qty 2.5

## 2014-07-09 MED ORDER — IOHEXOL 350 MG/ML SOLN
100.0000 mL | Freq: Once | INTRAVENOUS | Status: AC | PRN
  Administered 2014-07-09: 100 mL via INTRAVENOUS

## 2014-07-09 MED ORDER — SODIUM CHLORIDE 0.9 % IV SOLN
INTRAVENOUS | Status: AC | PRN
  Administered 2014-07-09: 1000 mL via INTRAVENOUS

## 2014-07-09 MED ORDER — SODIUM BICARBONATE 8.4 % IV SOLN
100.0000 meq | Freq: Once | INTRAVENOUS | Status: AC
  Administered 2014-07-09: 100 meq via INTRAVENOUS

## 2014-07-09 NOTE — ED Notes (Signed)
crp in process. See code narrator

## 2014-07-09 NOTE — ED Provider Notes (Signed)
CSN: [id number]     Arrival date & time 06/24/2014  1901 History   First MD Initiated Contact with Patient 06/18/2014 1936     Chief Complaint  Patient presents with  . Cardiac Arrest     (Consider location/radiation/quality/duration/timing/severity/associated sxs/prior Treatment) Patient is a 49 y.o. male presenting with general illness. The history is provided by the EMS personnel.  Illness Location:  Cardiopulmonary arrest Severity:  Severe Onset quality:  Sudden Duration:  1 hour Timing:  Constant Progression:  Unchanged Chronicity:  New Context:  Standing at the counter at work Ineffective treatments:  Compressions, epinephrine, advanced airway intervention   No past medical history on file. No past surgical history on file. No family history on file. History  Substance Use Topics  . Smoking status: Not on file  . Smokeless tobacco: Not on file  . Alcohol Use: Not on file    Review of Systems  Unable to perform ROS: Acuity of condition   LEVEL 5 EXCEPTION TO HISTORY: HIGH ACUITY CONDITION    Allergies  Review of patient's allergies indicates not on file.  Home Medications   Prior to Admission medications   Not on File   BP 119/73 mmHg  Pulse 86  Temp(Src) 96.6 F (35.9 C) (Axillary)  Resp 28  Ht 5\' 7"  (1.702 m)  Wt 375 lb 10.6 oz (170.4 kg)  BMI 58.82 kg/m2  SpO2 100% Physical Exam  Constitutional: He appears toxic.  Morbidly obese  HENT:  Head: Normocephalic. Head is with contusion.  Eyes: Right pupil is not reactive. Left pupil is not reactive.  Fixed and dilated pupils  Neck: No tracheal deviation present.  Cardiovascular:  Distant heart sounds noted likely due to habitus  Pulmonary/Chest: He has decreased breath sounds (course mechanical breath sounds bilaterally equal).  Abdominal: He exhibits no distension.  Neurological: He is unresponsive. GCS eye subscore is 1. GCS verbal subscore is 1. GCS motor subscore is 1.  Skin:  Mottling diffusely     ED Course  ARTERIAL LINE Date/Time: Jul 29, 2014 12:57 AM Performed by: Lyndal Pulley Authorized by: Pricilla Loveless Consent: The procedure was performed in an emergent situation. Patient identity confirmed: provided demographic data, arm band and hospital-assigned identification number Indications: multiple ABGs, respiratory failure and hemodynamic monitoring Location: right radial Allen's test normal: yes Needle gauge: 22 Number of attempts: 1 Post-procedure: dressing applied Post-procedure CMS: normal Patient tolerance: Patient tolerated the procedure well with no immediate complications Comments: Ultrasound guidance   (including critical care time) INTUBATION Performed by: Lyndal Pulley  Required items: required blood products, implants, devices, and special equipment available Patient identity confirmed: provided demographic data and hospital-assigned identification number Time out: Immediately prior to procedure a "time out" was called to verify the correct patient, procedure, equipment, support staff and site/side marked as required.  Indications: arrest  Intubation method: Glidescope Laryngoscopy   Preoxygenation: BVM  Sedatives: none, obtunded  Tube Size: 7.5 cuffed  Post-procedure assessment: chest rise and ETCO2 monitor Breath sounds: equal and absent over the epigastrium Tube secured with: ETT holder Chest x-ray interpreted by radiologist and me.  Chest x-ray findings:endotracheal tube in appropriate position  Patient tolerated the procedure well with no immediate complications.    Labs Review Labs Reviewed  MRSA PCR SCREENING - Abnormal; Notable for the following:    MRSA by PCR POSITIVE (*)    All other components within normal limits  CBC WITH DIFFERENTIAL/PLATELET - Abnormal; Notable for the following:    WBC 13.0 (*)  RBC 3.94 (*)    Hemoglobin 12.1 (*)    HCT 38.8 (*)    Lymphs Abs 5.2 (*)    All other components within normal limits   BASIC METABOLIC PANEL - Abnormal; Notable for the following:    CO2 10 (*)    Glucose, Bld 364 (*)    Creatinine, Ser 1.90 (*)    Calcium 8.5 (*)    GFR calc non Af Amer 40 (*)    GFR calc Af Amer 47 (*)    Anion gap 26 (*)    All other components within normal limits  HEPATIC FUNCTION PANEL - Abnormal; Notable for the following:    Total Protein 5.7 (*)    Albumin 2.8 (*)    AST 105 (*)    ALT 82 (*)    Indirect Bilirubin 0.2 (*)    All other components within normal limits  URINALYSIS, ROUTINE W REFLEX MICROSCOPIC (NOT AT [facility]) - Abnormal; Notable for the following:    Protein, ur 100 (*)    All other components within normal limits  PHOSPHORUS - Abnormal; Notable for the following:    Phosphorus 6.2 (*)    All other components within normal limits  TROPONIN I - Abnormal; Notable for the following:    Troponin I 1.87 (*)    All other components within normal limits  LACTIC ACID, PLASMA - Abnormal; Notable for the following:    Lactic Acid, Venous 11.0 (*)    All other components within normal limits  BRAIN NATRIURETIC PEPTIDE - Abnormal; Notable for the following:    B Natriuretic Peptide 203.8 (*)    All other components within normal limits  PROTIME-INR - Abnormal; Notable for the following:    Prothrombin Time 17.8 (*)    All other components within normal limits  TSH - Abnormal; Notable for the following:    TSH 6.718 (*)    All other components within normal limits  CARBOXYHEMOGLOBIN - Abnormal; Notable for the following:    Total hemoglobin 12.7 (*)    Carboxyhemoglobin 1.9 (*)    All other components within normal limits  GLUCOSE, CAPILLARY - Abnormal; Notable for the following:    Glucose-Capillary 298 (*)    All other components within normal limits  URINE MICROSCOPIC-ADD ON - Abnormal; Notable for the following:    Squamous Epithelial / LPF FEW (*)    Bacteria, UA FEW (*)    All other components within normal limits  I-STAT CG4 LACTIC ACID, ED - Abnormal;  Notable for the following:    Lactic Acid, Venous >17.00 (*)    All other components within normal limits  I-STAT TROPOININ, ED - Abnormal; Notable for the following:    Troponin i, poc 0.16 (*)    All other components within normal limits  I-STAT CHEM 8, ED - Abnormal; Notable for the following:    Creatinine, Ser 1.50 (*)    Glucose, Bld 346 (*)    Calcium, Ion 1.10 (*)    All other components within normal limits  I-STAT ARTERIAL BLOOD GAS, ED - Abnormal; Notable for the following:    pH, Arterial 6.901 (*)    pCO2 arterial 78.2 (*)    pO2, Arterial 131.0 (*)    Bicarbonate 15.4 (*)    Acid-base deficit 18.0 (*)    All other components within normal limits  CG4 I-STAT (LACTIC ACID) - Abnormal; Notable for the following:    Lactic Acid, Venous 5.46 (*)    All other  components within normal limits  POCT I-STAT 3, ART BLOOD GAS (G3+) - Abnormal; Notable for the following:    pH, Arterial 7.313 (*)    pO2, Arterial 189.0 (*)    Acid-base deficit 5.0 (*)    All other components within normal limits  CLOSTRIDIUM DIFFICILE BY PCR (NOT AT [facility])  URINE RAPID DRUG SCREEN, HOSP PERFORMED  MAGNESIUM  PROCALCITONIN  CORTISOL  BLOOD GAS, ARTERIAL  TROPONIN I  TROPONIN I  BLOOD GAS, ARTERIAL  BASIC METABOLIC PANEL  BLOOD GAS, ARTERIAL  MAGNESIUM  PHOSPHORUS  CBC  I-STAT CG4 LACTIC ACID, ED  TYPE AND SCREEN  ABO/RH    Imaging Review Ct Head Wo Contrast  2014-09-21   CLINICAL DATA:  Prolonged cardiac arrest.  Altered mental status.  EXAM: CT HEAD WITHOUT CONTRAST  TECHNIQUE: Contiguous axial images were obtained from the base of the skull through the vertex without intravenous contrast.  COMPARISON:  None.  FINDINGS: There is diffuse cerebral edema with obliteration of most of the cortical sulci. Ventricles are slightly compressed.  There is no acute intracranial hemorrhage or infarction. There is cerebellar edema. Fourth ventricle is compressed. Abnormal lucency in the pons and  medulla.  No acute osseous abnormality.  IMPRESSION: Diffuse cerebral edema including the brainstem.   Electronically Signed   By: Francene BoyersJames  Maxwell M.D.   On: 02016-09-10 21:53   Ct Angio Chest Pe W/cm &/or Wo Cm  2014-09-21   CLINICAL DATA:  49 year old male with prolonged cardiac arrest.  EXAM: CT ANGIOGRAPHY CHEST WITH CONTRAST  TECHNIQUE: Multidetector CT imaging of the chest was performed using the standard protocol during bolus administration of intravenous contrast. Multiplanar CT image reconstructions and MIPs were obtained to evaluate the vascular anatomy.  CONTRAST:  100mL OMNIPAQUE IOHEXOL 350 MG/ML SOLN  COMPARISON:  Chest x-ray dated 02016-09-10  FINDINGS: Evaluation of this exam is limited due to streak artifact caused by patient's arms and body habitus.  Lungs, pleural spaces, and central airways: There are bilateral linear atelectasis/ scarring. Bilateral lower lobe subsegmental atelectatic changes noted. There is no airspace consolidation or pleural effusion. No pneumothorax. An endotracheal tube noted with tip above the carina. The central airways are patent.  Vasculature: Evaluation of the pulmonary arteries is limited due to streak artifact. No central pulmonary artery embolus identified. No thoracic aortic aneurysm or dissection. Left IJ central line with tip in the upper SVC at the level of the left innominate vein.  Heart: No cardiomegaly. No pericardial effusion.  Mediastinum: Unremarkable. No mass. The An enteric tube is partially visualized extending into the stomach.  Lymph nodes: No adenopathy .  Chest wall/ musculoskeletal: Unremarkable. No acute fracture.  Upper abdomen: Unremarkable as visualized. There is eventration of the right hemidiaphragm.  Review of the MIP images confirms the above findings.  IMPRESSION: No acute intrathoracic pathology. No CT evidence of pulmonary embolism.   Electronically Signed   By: Elgie CollardArash  Radparvar M.D.   On: 02016-09-10 22:06   Dg Chest Portable 1  View  2014-09-21   CLINICAL DATA:  Central catheter placement  EXAM: PORTABLE CHEST - 1 VIEW  COMPARISON:  Study obtained earlier in the day  FINDINGS: There is a new central catheter with the tip in the superior vena cava just beyond the junction with the left innominate vein. Endotracheal tube tip is 4.3 cm above the carina. Nasogastric tube tip and side port are below the diaphragm. No pneumothorax. There is elevation the right hemidiaphragm. There is trace edema. There is new  focal atelectasis in the right lower lobe. Heart is enlarged with pulmonary vascularity within normal limits.  IMPRESSION: New central catheter present with tip in the superior vena cava just beyond the junction with the left innominate vein. No pneumothorax. Slight edema. New atelectasis right lower lobe. Persistent elevation right hemidiaphragm. No change in cardiac silhouette.   Electronically Signed   By: Bretta Bang III M.D.   On: 07/21/14 20:50   Dg Chest Port 1 View  Jul 21, 2014   CLINICAL DATA:  Status post cardiac arrest  EXAM: PORTABLE CHEST - 1 VIEW  COMPARISON:  None.  FINDINGS: Endotracheal tube tip is 4.4 cm above the carina. Nasogastric tube tip and side port are below the diaphragm. No pneumothorax. There is mild interstitial edema, mildly asymmetric toward the right. No edema or consolidation. Heart appears mildly enlarged with pulmonary vascularity within normal limits. No adenopathy.  IMPRESSION: Tube positions as described without pneumothorax. Mild edema, slightly more on the right than on the left. Cardiac prominence. Suspect a degree of congestive heart failure.   Electronically Signed   By: Bretta Bang III M.D.   On: 21-Jul-2014 20:08     EKG Interpretation   Date/Time:  Tuesday 2014/07/21 19:36:36 EDT Ventricular Rate:  94 PR Interval:  193 QRS Duration: 98 QT Interval:  425 QTC Calculation: 531 R Axis:   65 Text Interpretation:  Sinus rhythm Low voltage, extremity leads Repol  abnrm,  severe global ischemia (LM/MVD) Prolonged QT interval unchanged  from earlier in the day Confirmed by GOLDSTON  MD, SCOTT ([id number]) on  07-21-14 7:46:51 PM      MDM   Final diagnoses:  Cardiac arrest    49 year old male presents following sudden witnessed cardiac arrest while working at [employer]. Per bystanders report to EMS the patient suddenly collapsed and fell to the ground and was shaking intermittently, EMS found him to be pulseless, apneic, and unresponsive. Compressions were started and prehospital a King airway was placed, compressions and a rounds of epinephrine were given for multiple recurrences of asystole and they did not achieve return to spontaneous circulation at any point prior to arrival at the hospital. After 2 rounds of epinephrine here and intubation return of spontaneous reduction was achieved. The patient's EKG did not show a STEMI or any focuses of arrhythmia, he did have global depressions concerning for hypoxic ischemic injury. He remained clinically neurologically devastated throughout his emergency department course without any sedation required, critical care was consulted to see the patient in the emergency department and admit him. He had a moderate requirement for pressors and was aggressively fluid resuscitated. Head bleed, pulmonary embolism, acute cardiac pathology or respiratory arrest secondary to extreme obesity and syncopal episode resulting in hypoxic ischemic injury were considered. No indication was noted for lytic therapy and no family was available during the emergency department course for providing updates.  Lyndal Pulley, MD 07/09/2014 1610  Pricilla Loveless, MD 07/11/14 [phone]

## 2014-07-09 NOTE — Progress Notes (Signed)
eLink Physician-Brief Progress Note Patient Name: Ross Watts DOB: 1965/05/20 MRN: [id number]   Date of Service  2014-06-20  HPI/Events of Note  CTA - no PE C T Head - nil acute  eICU Interventions  D.w APP Hoffmann - will consult neuro hospitalist - Hoffman to call them     Intervention Category Intermediate Interventions: Change in mental status - evaluation and management;Other:;Diagnostic test evaluation  [name] 2014-06-20, 10:22 PM

## 2014-07-09 NOTE — H&P (Addendum)
PULMONARY / CRITICAL CARE MEDICINE   Name: Ross Watts MRN: [id number] DOB: [birth year]    ADMISSION DATE:  06/14/2014 CONSULTATION DATE:  06/29/2014  REFERRING MD :  Sadie Haber  CHIEF COMPLAINT:  Arrest  INITIAL PRESENTATION: 49 year old male presented to [facility] ED after prolonged cardiac arrest (upwards of an hour long).  High pressor demands in ED with poor neuro exam. PCCM to admit.   STUDIES:  CTA chest 6/28 >>> CT head 6/28 >>>  SIGNIFICANT EVENTS: 6/28 Cardiac arrest > to ICU  HISTORY OF PRESENT ILLNESS:  49 year old male with PMH of HTN and bipolar disorder. He had a witnessed cardiac arrest with prolonged resuscitation. 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 Police reviewed security footage, which showed him sitting at store counter and very suddenly went unconscious.  Total resuscitation attempts were initiated by EMS and continued in ED. Estimated one hour in duration with multiple doses of epinephrine prior to ROSC. In ED he was noted to be comatose s/p resuscitation with very high vasopressor demands. PCCM to admit.    PAST MEDICAL HISTORY :   has no past medical history on file.  has no past surgical history on file. Prior to Admission medications   Not on File   Not on File  FAMILY HISTORY:  has no family status information on file.  SOCIAL HISTORY:    REVIEW OF SYSTEMS:  unable  SUBJECTIVE:   VITAL SIGNS: Pulse Rate:  [85-107] 85 (06/28 2004) Resp:  [18-25] 19 (06/28 2004) BP: (61-127)/(36-101) 61/36 mmHg (06/28 1950) SpO2:  [95 %-99 %] 96 % (06/28 2004) Arterial Line BP: (107-111)/(48-49) 111/49 mmHg (06/28 2004) HEMODYNAMICS:   VENTILATOR SETTINGS:   INTAKE / OUTPUT: No intake or output data in the 24 hours ending 06/25/2014 2006  PHYSICAL EXAMINATION: General:  Morbidly obese male on vent Neuro:  Comatose on vent. Intermittent gag. GCS 3.  HEENT:  [place]/AT, no JVD noted Cardiovascular:  RRR, no MRG noted Lungs:  [place] L >  R Abdomen:  Obese, soft, non-distended Musculoskeletal:  No acute deformity or ROM limitation.  Skin:  Grossly intact.   LABS:  CBC  Recent Labs Lab 07/04/2014 1934  HGB 13.3  HCT 39.0   Coag's No results for input(s): APTT, INR in the last 168 hours. BMET  Recent Labs Lab 06/14/2014 1934  NA 142  K 3.6  CL 107  BUN 14  CREATININE 1.50*  GLUCOSE 346*   Electrolytes No results for input(s): CALCIUM, MG, PHOS in the last 168 hours. Sepsis Markers  Recent Labs Lab 07/09/14 1935  LATICACIDVEN >17.00*   ABG No results for input(s): PHART, PCO2ART, PO2ART in the last 168 hours. Liver Enzymes No results for input(s): AST, ALT, ALKPHOS, BILITOT, ALBUMIN in the last 168 hours. Cardiac Enzymes No results for input(s): TROPONINI, PROBNP in the last 168 hours. Glucose No results for input(s): GLUCAP in the last 168 hours.  Imaging No results found.   ASSESSMENT / PLAN:  PULMONARY OETT 6/28 >>> A: Acute mixed respiratory failure secondary to cardiac arrest ?PE  P:   Full vent support F/u ABG, CXR VAP bundle SBT when meets criteria   CARDIOVASCULAR CVL [facility] 6/28 >>> A:  Cardiogenic shock s/p arrest H/o HTN  P:  Telemetry monitoring  MAP goal > 52mm/Hg Gentle volume resuscitation CVL Levophed, vaso for MAP goal CVP monitoring ScvO2 Echo Trend lactic EKG in AM Holding home antihypertensives.   RENAL A:   AKI High AG metabolic  acidosis   P:   Follow Bmet closely Correct electrolytes as indicated  GASTROINTESTINAL A:   No acute issues  P:   NPO SUP: IV famotidine   HEMATOLOGIC A:   No acute issues  P:  SCDs until head CT cleared Follow CBC Transfuse per ICU guidelines  INFECTIOUS A:   No obvious signs/source of infection  P:   PCT Monitor off ABX for now  ENDOCRINE A:   Hyperglycemia with uncertain DM history  P:   Insulin gtt protocol TSH Corisol  NEUROLOGIC A:   Acute anoxic encephalopathy Bipolar  disorder  P:   RASS goal: 0 Monitor    FAMILY  - Updates: Message left for family  - Inter-disciplinary family meet or Palliative Care meeting due by:  7/5   Joneen RoachPaul Hoffman, AGACNP-BC [place] [facility]/Critical Care Pager [phone] or [phone]  06/18/2014 8:31 PM  Attending:  I have seen and examined the patient with nurse practitioner/resident and agree with the note above.   Ross Watts has hypertension and bipolar disorder and had a sudden PEA arrest today while working.  Per police report, the security camera showed him suddenly drop while working today.  He then had a one hour PEA arrest receiving what is estimated to be 16-18 amps of epinephrine.  On exam: Morbidly obese, on vent HENT: scalp hematoma noted Lungs with rhonchi bilat CV: RRR, no mgr AB: obese, soft, nontender Derrm: warm, no edema,   Labs reviewed> severe metabolic acidosis, lactic acid 17 AKI  Impression/Plan Acute respiratory failure in setting of cardiac arrest > full vent support Cardiac arrest> uncertain etiology> seizure leading to hypoxemia?, PE?, now with diffuse ST wave changes to MI possible, but no a candidate for cath Cardiogenic shock> vasopressors as needed for MAP > 65, check CVP, continue IVF AKI> IVF Lactic acidosis> as above  Acute encephalopathy> high risk for severe anoxic brain injury (almost certain at this point considering prolonged downtime); minimize sedation, monitor neuro status  Called sister, no answer, left message  My cc time 60 minutes  Heber CarolinaBrent McQuaid, MD [facility] Pager: [phone] Cell: [phone] After 3pm or if no response, call [phone]

## 2014-07-09 NOTE — Procedures (Signed)
Central Venous Catheter Insertion Procedure Note Ross Watts [id number] 07/28/1965  Procedure: Insertion of Central Venous Catheter Indications: Assessment of intravascular volume and Drug and/or fluid administration  Procedure Details Consent: Risks of procedure as well as the alternatives and risks of each were explained to the (patient/caregiver).  Consent for procedure obtained. Time Out: Verified patient identification, verified procedure, site/side was marked, verified correct patient position, special equipment/implants available, medications/allergies/relevent history reviewed, required imaging and test results available.  Performed  Maximum sterile technique was used including antiseptics, cap, gloves, gown, hand hygiene, mask and sheet. Skin prep: Chlorhexidine; local anesthetic administered A antimicrobial bonded/coated triple lumen catheter was placed in the left internal jugular vein using the Seldinger technique.  Ultrasound was used to verify the patency of the vein and for real time needle guidance.  Evaluation Blood flow good Complications: No apparent complications Patient did tolerate procedure well. Chest X-ray ordered to verify placement.  CXR: pending.  Rhetta Cleek 06/27/2014, 8:28 PM

## 2014-07-09 NOTE — Progress Notes (Signed)
ANTICOAGULATION CONSULT NOTE - Initial Consult  Pharmacy Consult for Heparin Indication: chest pain/ACS and pulmonary embolus  Not on File  Patient Measurements: Height: 5\' 7"  (170.2 cm) Weight: (!) 375 lb 10.6 oz (170.4 kg) IBW/kg (Calculated) : 66.1 Heparin Dosing Weight: 109 kg  Vital Signs: BP: 129/57 mmHg (06/28 2045) Pulse Rate: 70 (06/28 2045)  Labs:  Recent Labs  12/18/2014 1930 12/18/2014 1934 12/18/2014 2054  HGB 12.1* 13.3  --   HCT 38.8* 39.0  --   PLT 202  --   --   LABPROT  --   --  17.8*  INR  --   --  1.46  CREATININE 1.90* 1.50*  --     CrCl cannot be calculated (Unknown ideal weight.).   Medical History: No past medical history on file.  Medications:   (Not in a hospital admission) Scheduled:  . ipratropium-albuterol  3 mL Nebulization Q6H   Infusions:  . sodium chloride 1,000 mL (12/18/2014 1904)  . sodium chloride    . famotidine (PEPCID) IV    . insulin (NOVOLIN-R) infusion    . norepinephrine (LEVOPHED) Adult infusion 40 mcg/min (12/18/2014 1952)  .  sodium bicarbonate  infusion 1000 mL 125 mL/hr at 12/18/2014 2028  . vasopressin (PITRESSIN) infusion - *FOR SHOCK* 0.03 Units/min (12/18/2014 2028)    Assessment: 49yo male presents in cardiac arrest. Pharmacy is consulted to dose heparin for rule out PE and ACS/NSTEMI. Hgb 13.3, INR 1.5, sCr 1.5.  Goal of Therapy:  Heparin level 0.3-0.7 units/ml Monitor platelets by anticoagulation protocol: Yes   Plan:  Give 5000 units bolus x 1 Start heparin infusion at 1900 units/hr Check anti-Xa level in 6 hours and daily while on heparin Continue to monitor H&H and platelets  Arlean Hoppingorey M. Newman PiesBall, PharmD Clinical Pharmacist Pager [phone] 10-15-2014,9:36 PM

## 2014-07-10 ENCOUNTER — Inpatient Hospital Stay (HOSPITAL_COMMUNITY): Payer: Medicaid Other

## 2014-07-10 DIAGNOSIS — G931 Anoxic brain damage, not elsewhere classified: Secondary | ICD-10-CM

## 2014-07-10 LAB — POCT I-STAT 3, ART BLOOD GAS (G3+)
Acid-base deficit: 5 mmol/L — ABNORMAL HIGH (ref 0.0–2.0)
Bicarbonate: 20.6 mEq/L (ref 20.0–24.0)
O2 Saturation: 100 %
PCO2 ART: 40.6 mmHg (ref 35.0–45.0)
Patient temperature: 98.6
TCO2: 22 mmol/L (ref 0–100)
pH, Arterial: 7.313 — ABNORMAL LOW (ref 7.350–7.450)
pO2, Arterial: 189 mmHg — ABNORMAL HIGH (ref 80.0–100.0)

## 2014-07-10 LAB — CBC
HCT: 39.2 % (ref 39.0–52.0)
Hemoglobin: 13.9 g/dL (ref 13.0–17.0)
MCH: 31 pg (ref 26.0–34.0)
MCHC: 35.5 g/dL (ref 30.0–36.0)
MCV: 87.5 fL (ref 78.0–100.0)
PLATELETS: 193 10*3/uL (ref 150–400)
RBC: 4.48 MIL/uL (ref 4.22–5.81)
RDW: 12.9 % (ref 11.5–15.5)
WBC: 16.1 10*3/uL — ABNORMAL HIGH (ref 4.0–10.5)

## 2014-07-10 LAB — BASIC METABOLIC PANEL
ANION GAP: 13 (ref 5–15)
BUN: 17 mg/dL (ref 6–20)
CHLORIDE: 104 mmol/L (ref 101–111)
CO2: 21 mmol/L — AB (ref 22–32)
CREATININE: 2.21 mg/dL — AB (ref 0.61–1.24)
Calcium: 7.1 mg/dL — ABNORMAL LOW (ref 8.9–10.3)
GFR calc non Af Amer: 33 mL/min — ABNORMAL LOW (ref >60)
GFR, EST AFRICAN AMERICAN: 39 mL/min — AB (ref >60)
GLUCOSE: 179 mg/dL — AB (ref 65–99)
Potassium: 2.4 mmol/L — CL (ref 3.5–5.1)
Sodium: 138 mmol/L (ref 135–145)

## 2014-07-10 LAB — MRSA PCR SCREENING: MRSA by PCR: POSITIVE — AB

## 2014-07-10 LAB — GLUCOSE, CAPILLARY
GLUCOSE-CAPILLARY: 173 mg/dL — AB (ref 65–99)
GLUCOSE-CAPILLARY: 136 mg/dL — AB (ref 65–99)
GLUCOSE-CAPILLARY: 152 mg/dL — AB (ref 65–99)
GLUCOSE-CAPILLARY: 168 mg/dL — AB (ref 65–99)
GLUCOSE-CAPILLARY: 125 mg/dL — AB (ref 65–99)
GLUCOSE-CAPILLARY: 175 mg/dL — AB (ref 65–99)
Glucose-Capillary: 167 mg/dL — ABNORMAL HIGH (ref 65–99)
Glucose-Capillary: 239 mg/dL — ABNORMAL HIGH (ref 65–99)
Glucose-Capillary: 151 mg/dL — ABNORMAL HIGH (ref 65–99)
Glucose-Capillary: 186 mg/dL — ABNORMAL HIGH (ref 65–99)
Glucose-Capillary: 205 mg/dL — ABNORMAL HIGH (ref 65–99)

## 2014-07-10 LAB — BLOOD GAS, ARTERIAL
ACID-BASE DEFICIT: 6.1 mmol/L — AB (ref 0.0–2.0)
BICARBONATE: 17.9 meq/L — AB (ref 20.0–24.0)
Drawn by: 39898
FIO2: 0.6 %
MECHVT: 530 mL
O2 Saturation: 98.2 %
PEEP: 5 cmH2O
PO2 ART: 111 mmHg — AB (ref 80.0–100.0)
Patient temperature: 98.6
RATE: 28 resp/min
TCO2: 18.9 mmol/L (ref 0–100)
pCO2 arterial: 30.1 mmHg — ABNORMAL LOW (ref 35.0–45.0)
pH, Arterial: 7.392 (ref 7.350–7.450)

## 2014-07-10 LAB — DIC (DISSEMINATED INTRAVASCULAR COAGULATION)PANEL
D-Dimer, Quant: >20 ug/mL-FEU — ABNORMAL HIGH (ref 0.00–0.48)
INR: 1.56 — ABNORMAL HIGH (ref 0.00–1.49)
Platelets: 195 10*3/uL (ref 150–400)
Smear Review: NONE SEEN
aPTT: 105 seconds — ABNORMAL HIGH (ref 24–37)

## 2014-07-10 LAB — MAGNESIUM: Magnesium: 2.4 mg/dL (ref 1.7–2.4)

## 2014-07-10 LAB — PHOSPHORUS: Phosphorus: 3.1 mg/dL (ref 2.5–4.6)

## 2014-07-10 LAB — DIC (DISSEMINATED INTRAVASCULAR COAGULATION) PANEL
FIBRINOGEN: 279 mg/dL (ref 204–475)
PROTHROMBIN TIME: 18.7 s — AB (ref 11.6–15.2)

## 2014-07-10 LAB — CLOSTRIDIUM DIFFICILE BY PCR: Toxigenic C. Difficile by PCR: NEGATIVE

## 2014-07-10 LAB — TROPONIN I
Troponin I: 62.85 ng/mL (ref <0.031)
Troponin I: >65 ng/mL (ref <0.031)

## 2014-07-10 MED ORDER — MORPHINE BOLUS VIA INFUSION
5.0000 mg | INTRAVENOUS | Status: DC | PRN
  Filled 2014-07-10: qty 20

## 2014-07-10 MED ORDER — POTASSIUM CHLORIDE CRYS ER 20 MEQ PO TBCR: 40.0000 meq | EXTENDED_RELEASE_TABLET | Freq: Once | ORAL | Status: DC

## 2014-07-10 MED ORDER — CHLORHEXIDINE GLUCONATE CLOTH 2 % EX PADS
6.0000 | MEDICATED_PAD | Freq: Every day | CUTANEOUS | Status: DC
  Administered 2014-07-10: 6 via TOPICAL

## 2014-07-10 MED ORDER — POTASSIUM CHLORIDE 10 MEQ/50ML IV SOLN
10.0000 meq | INTRAVENOUS | Status: DC
  Administered 2014-07-10: 10 meq via INTRAVENOUS
  Filled 2014-07-10 (×4): qty 50

## 2014-07-10 MED ORDER — CHLORHEXIDINE GLUCONATE 0.12 % MT SOLN
15.0000 mL | Freq: Two times a day (BID) | OROMUCOSAL | Status: DC
  Administered 2014-07-10 (×2): 15 mL via OROMUCOSAL
  Filled 2014-07-10 (×2): qty 15

## 2014-07-10 MED ORDER — MUPIROCIN 2 % EX OINT
1.0000 "application " | TOPICAL_OINTMENT | Freq: Two times a day (BID) | CUTANEOUS | Status: DC
  Administered 2014-07-10: 1 via NASAL
  Filled 2014-07-10: qty 22

## 2014-07-10 MED ORDER — MORPHINE SULFATE 25 MG/ML IV SOLN
10.0000 mg/h | INTRAVENOUS | Status: DC
  Filled 2014-07-10: qty 10

## 2014-07-10 MED ORDER — POTASSIUM CHLORIDE 10 MEQ/50ML IV SOLN
10.0000 meq | INTRAVENOUS | Status: AC
  Administered 2014-07-10 (×3): 10 meq via INTRAVENOUS
  Filled 2014-07-10: qty 50

## 2014-07-10 MED ORDER — CETYLPYRIDINIUM CHLORIDE 0.05 % MT LIQD
7.0000 mL | Freq: Four times a day (QID) | OROMUCOSAL | Status: DC
  Administered 2014-07-10: 7 mL via OROMUCOSAL

## 2014-07-10 MED ORDER — POTASSIUM CHLORIDE 20 MEQ/15ML (10%) PO SOLN
40.0000 meq | Freq: Once | ORAL | Status: DC
  Filled 2014-07-10: qty 30

## 2014-07-10 MED ORDER — MORPHINE SULFATE 4 MG/ML IJ SOLN
INTRAMUSCULAR | Status: AC
  Administered 2014-07-10: 10 mg
  Filled 2014-07-10: qty 3

## 2014-07-10 MED ORDER — MORPHINE SULFATE 10 MG/ML IJ SOLN: 10.0000 mg | INTRAMUSCULAR | Status: DC | PRN

## 2014-07-11 ENCOUNTER — Telehealth: Payer: Self-pay

## 2014-07-11 NOTE — Telephone Encounter (Signed)
Received cremation death certificate from [facility] via fax 07-11-14 for Dr. Molli KnockYacoub.  Will courier to 3100 this afternoon for signature.  Received back and faxed to [facility].  Will await original to be mailed.  Received original 07-17-14.  Will forward to Dr. Craige CottaSood for signature as Dr. Molli KnockYacoub is not in office.  Received back 07-18-14.  [facility] 07-18-14.

## 2014-07-12 MED FILL — Medication: Qty: 1 | Status: AC

## 2014-07-12 NOTE — Progress Notes (Signed)
09/07/14- Respiratory care note- Pt terminally extubated as 1145 as per MD order.  RN and family at bedside for procedure.  s Ross Watts rrt,rcp

## 2014-07-12 NOTE — Progress Notes (Signed)
Chaplain became involved initially as a CPR trauma. Pt came with no heartbeat. Heartbeat was revived. Chaplain and Dr tried to reach Pt's family was unable to do so. Dr. Left a message. Pt's family was at work and came to hospital 6 hours later . Chaplain received a page for emotional support for Pt's family. Chaplain walked with family to get phone and returned to Pt's room to meet with the Dr. Moreen FowlerPt's family member was having a difficult time due to loss during the last 2 years and mothers current medical condition. Another family member committed suicide 2 months ago and 48 year old nephew died 2 years ago. Sister and mother are the only family left. Sister left to tell mother about Pt. Chaplain prayed for family and offered support.   02/28/2014 0600  Clinical Encounter Type  Visited With Family  Visit Type Spiritual support  Referral From Nurse;Care management  Spiritual Encounters  Spiritual Needs Prayer;Emotional  Stress Factors  Patient Stress Factors Major life changes  Family Stress Factors Loss   Should follow up.

## 2014-07-12 NOTE — Progress Notes (Signed)
PCCM INTERVAL PROGRESS NOTE  Sister updated as to patient's status and prognosis. She is very tearful as she recently lost her other brother in February. She comprehends the severity of his current condition, but is not prepared to make any code status changes at this time without first speaking to her mother.    Joneen RoachPaul Hoffman, AGACNP-BC [facility] Pager [phone] or [phone]  12-14-2014 4:59 AM

## 2014-07-12 NOTE — Procedures (Signed)
ELECTROENCEPHALOGRAM REPORT   Patient: Ross Watts       Room #: 3M11 EEG No. ID: [id number] Age: 49 y.o.        Sex: male Referring Physician: Kendrick FriesMcQuaid Report Date:  12-26-14        Interpreting Physician: Thana FarrEYNOLDS, Connery Shiffler  History: Ross Watts is an 49 y.o. male with a history of bipolar disease s/p arrest   Medications:  Scheduled: . antiseptic oral rinse  7 mL Mouth Rinse QID  . chlorhexidine  15 mL Mouth Rinse BID  . famotidine (PEPCID) IV  20 mg Intravenous Q12H  . ipratropium-albuterol  3 mL Nebulization Q6H    Conditions of Recording:  This is a 16 channel EEG carried out with the patient in the intubated and unresponsive state.  Description:  No background activity is appreciated throughout the recording. Initially the recording is performed at the standard 7uV/mm.  With increase in sensitivity to 2uV/mm there continues to be no cerebral activity present.   The patient's eyes are opened by the technician with no change in the recording. Painful stimuli are applied bilaterally.  Despite the painful stimuli there is no change in the recording.  No epileptiform activity is noted.  Hyperventilation and intermittent photic stimulation were not performed.   IMPRESSION: This is an abnormal electroencephalogram due to the absence of cerebral activity.  No epileptiform activity is noted.     Thana FarrLeslie Lorrena Goranson, MD [facility] [phone] 12-26-14, 2:00 AM

## 2014-07-12 NOTE — Progress Notes (Signed)
EKG CRITICAL VALUE     12 lead EKG performed.  Critical value noted.  Nada LibmanMegan Berman, RN notified.   Briante Loveall C, CCT 07/09/2014 7:26 AM

## 2014-07-12 NOTE — Progress Notes (Signed)
Pt terminally extubated at 1145 by RT.  TOD 1216 with verification by auscultation of no heart beat by Santiago BurElizabeth Jerren Flinchbaugh, RN and Berniece Paponnie Dupont, RN.  Family at bedside during extubation and death.  CDS and MD notified.    Bosie HelperHandy, Reymundo Winship Noel 03-Nov-2014  12:39 PM

## 2014-07-12 NOTE — Progress Notes (Signed)
   July 31, 2014 1000  Clinical Encounter Type  Visited With Family;Health care provider;Patient not available  Visit Type Patient actively dying;Spiritual support;Psychological support  Spiritual Encounters  Spiritual Needs Prayer;Grief support;Emotional  Stress Factors  Family Stress Factors Loss  CH met with family to support in goals of care/end of life; Grief and spiritual support including prayer.

## 2014-07-12 NOTE — Progress Notes (Signed)
CRITICAL VALUE ALERT  Critical value received:  Troponin 62.85  Date of notification:  September 09, 2014  Time of notification:  0415  Critical value read back:Yes.    Nurse who received alert:  Audree BaneM. Telesha Deguzman, RN  MD notified (1st page):  Dr. Donette LarrySomer  Time of first page:  [phone]  MD notified (2nd page):  Time of second page:  Responding MD:  Dr. Donette LarrySomer  Time MD responded:  [phone]

## 2014-07-12 NOTE — Progress Notes (Signed)
Pt's belongings were left in room after pt was taken to morgue including a pair of black slacks, a driver's license, and a pack of cigarettes.  Family called and spoke with Gayla MedicusBarbie Holt, pt's mother, who requested that belongings be thrown away.    Bosie HelperHandy, Aubri Gathright Noel  06/30/2014  3:33 PM

## 2014-07-12 NOTE — Progress Notes (Signed)
PULMONARY / CRITICAL CARE MEDICINE   Name: Ross Watts MRN: [id number] DOB: June 24, 1965    ADMISSION DATE:  06/18/2014 CONSULTATION DATE:  06/16/2014  REFERRING MD :  Sadie Haber  CHIEF COMPLAINT:  Arrest  INITIAL PRESENTATION: 48 year old male [facility] ED after prolonged cardiac arrest (upwards of an hour long).  High pressor demands in ED with poor neuro exam. PCCM to admit.   STUDIES:  CTA chest 6/28 >>> CT head 6/28 >>>  SIGNIFICANT EVENTS: 6/28 Cardiac arrest > to ICU  HISTORY OF PRESENT ILLNESS:  49 year old male with PMH of HTN and bipolar disorder. He had a witnessed cardiac arrest with prolonged resuscitation. 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 Police reviewed security footage, which showed him sitting at store counter and very suddenly went unconscious.  Total resuscitation attempts were initiated by EMS and continued in ED. Estimated one hour in duration with multiple doses of epinephrine prior to ROSC. In ED he was noted to be comatose s/p resuscitation with very high vasopressor demands. PCCM to admit.    SUBJECTIVE: No events overnight  VITAL SIGNS: Temp:  [95.5 F (35.3 C)-98.8 F (37.1 C)] 98.8 F (37.1 C) (06/29 1000) Pulse Rate:  [61-115] 89 (06/29 1000) Resp:  [16-39] 28 (06/29 1000) BP: (61-173)/(31-101) 102/58 mmHg (06/29 1000) SpO2:  [95 %-100 %] 97 % (06/29 1000) Arterial Line BP: (99-111)/(45-49) 99/45 mmHg (06/28 2008) FiO2 (%):  [60 %-100 %] 60 % (06/29 0822) Weight:  [170.4 kg (375 lb 10.6 oz)] 170.4 kg (375 lb 10.6 oz) (06/29 0500)   HEMODYNAMICS: CVP:  [13 mmHg-18 mmHg] 18 mmHg   VENTILATOR SETTINGS: Vent Mode:  [-] PRVC FiO2 (%):  [60 %-100 %] 60 % Set Rate:  [18 bmp-28 bmp] 28 bmp Vt Set:  [530 mL-620 mL] 530 mL PEEP:  [5 cmH20-10 cmH20] 5 cmH20 Plateau Pressure:  [18 cmH20-28 cmH20] 26 cmH20   INTAKE / OUTPUT:  Intake/Output Summary (Last 24 hours) at 2014/07/22 1058 Last data filed at July 22, 2014 0900  Gross  per 24 hour  Intake 2711.98 ml  Output   1225 ml  Net 1486.98 ml   PHYSICAL EXAMINATION: General:  Morbidly obese male on vent Neuro:  Comatose on vent. No gag, dolls eye, corneal or pupillary reflex.  Does have a respiratory drive however shallow. HEENT:  [place]/AT, no JVD noted Cardiovascular:  RRR, no MRG noted Lungs:  Wheeze L > R Abdomen:  Obese, soft, non-distended Musculoskeletal:  No acute deformity or ROM limitation.  Skin:  Grossly intact.   LABS:  CBC  Recent Labs Lab 07/01/2014 1930 07/08/2014 1934 07/22/2014 0625  WBC 13.0*  --   --   HGB 12.1* 13.3  --   HCT 38.8* 39.0  --   PLT 202  --  195   Coag's  Recent Labs Lab 06/29/2014 2054 July 22, 2014 0625  APTT  --  105*  INR 1.46 1.56*   BMET  Recent Labs Lab 07/08/2014 1930 07/05/2014 1934 2014/07/22 0438  NA 141 142 138  K 3.6 3.6 2.4*  CL 105 107 104  CO2 10*  --  21*  BUN 11 14 17   CREATININE 1.90* 1.50* 2.21*  GLUCOSE 364* 346* 179*   Electrolytes  Recent Labs Lab 06/28/2014 1930 06/17/2014 2054 Jul 22, 2014 0438  CALCIUM 8.5*  --  7.1*  MG  --  2.2 2.4  PHOS  --  6.2* 3.1   Sepsis Markers  Recent Labs Lab 06/23/2014 1935 07/09/14 2054  10-23-14 2352  LATICACIDVEN >17.00* 11.0* 5.46*  PROCALCITON  --  <0.10  --    ABG  Recent Labs Lab 10-23-14 2014 07/02/2014 0024 07/02/2014 0500  PHART 6.901* 7.313* 7.392  PCO2ART 78.2* 40.6 30.1*  PO2ART 131.0* 189.0* 111*   Liver Enzymes  Recent Labs Lab 10-23-14 1930  AST 105*  ALT 82*  ALKPHOS 88  BILITOT 0.4  ALBUMIN 2.8*   Cardiac Enzymes  Recent Labs Lab 10-23-14 2054 06/26/2014 0313 06/26/2014 0800  TROPONINI 1.87* 62.85* >65.00*   Glucose  Recent Labs Lab 10-23-14 2208 10-23-14 2344 06/22/2014 0058 06/23/2014 0207  GLUCAP 298* 239* 167* 173*    Imaging Ct Head Wo Contrast  January 31, 2014   CLINICAL DATA:  Prolonged cardiac arrest.  Altered mental status.  EXAM: CT HEAD WITHOUT CONTRAST  TECHNIQUE: Contiguous axial images were obtained from  the base of the skull through the vertex without intravenous contrast.  COMPARISON:  None.  FINDINGS: There is diffuse cerebral edema with obliteration of most of the cortical sulci. Ventricles are slightly compressed.  There is no acute intracranial hemorrhage or infarction. There is cerebellar edema. Fourth ventricle is compressed. Abnormal lucency in the pons and medulla.  No acute osseous abnormality.  IMPRESSION: Diffuse cerebral edema including the brainstem.   Electronically Signed   By: Francene BoyersJames  Maxwell M.D.   On: 0January 21, 2016 21:53   Ct Angio Chest Pe W/cm &/or Wo Cm  January 31, 2014   CLINICAL DATA:  49 year old male with prolonged cardiac arrest.  EXAM: CT ANGIOGRAPHY CHEST WITH CONTRAST  TECHNIQUE: Multidetector CT imaging of the chest was performed using the standard protocol during bolus administration of intravenous contrast. Multiplanar CT image reconstructions and MIPs were obtained to evaluate the vascular anatomy.  CONTRAST:  100mL OMNIPAQUE IOHEXOL 350 MG/ML SOLN  COMPARISON:  Chest x-ray dated 0January 21, 2016  FINDINGS: Evaluation of this exam is limited due to streak artifact caused by patient's arms and body habitus.  Lungs, pleural spaces, and central airways: There are bilateral linear atelectasis/ scarring. Bilateral lower lobe subsegmental atelectatic changes noted. There is no airspace consolidation or pleural effusion. No pneumothorax. An endotracheal tube noted with tip above the carina. The central airways are patent.  Vasculature: Evaluation of the pulmonary arteries is limited due to streak artifact. No central pulmonary artery embolus identified. No thoracic aortic aneurysm or dissection. Left IJ central line with tip in the upper SVC at the level of the left innominate vein.  Heart: No cardiomegaly. No pericardial effusion.  Mediastinum: Unremarkable. No mass. The An enteric tube is partially visualized extending into the stomach.  Lymph nodes: No adenopathy .  Chest wall/ musculoskeletal:  Unremarkable. No acute fracture.  Upper abdomen: Unremarkable as visualized. There is eventration of the right hemidiaphragm.  Review of the MIP images confirms the above findings.  IMPRESSION: No acute intrathoracic pathology. No CT evidence of pulmonary embolism.   Electronically Signed   By: Elgie CollardArash  Radparvar M.D.   On: 0January 21, 2016 22:06   Dg Chest Port 1 View  07/08/2014   CLINICAL DATA:  Evaluate endotracheal tube position  EXAM: PORTABLE CHEST - 1 VIEW  COMPARISON:  Portable chest x-ray of 0January 21, 2016 and CT chest of 0January 21, 2016  FINDINGS: The lungs remain poorly aerated with basilar volume loss. The carina is very difficult to evaluate but the endotracheal tube tip does appear to lie above the expected position of the carina. Left central venous line is unchanged in position probably in the mid SVC. Cardiomegaly is stable.  IMPRESSION: Little change in  poor aeration, position the endotracheal tube, or position of left central venous line.   Electronically Signed   By: Dwyane Dee M.D.   On: 06/19/2014 08:07   Dg Chest Portable 1 View  07/17/14   CLINICAL DATA:  Central catheter placement  EXAM: PORTABLE CHEST - 1 VIEW  COMPARISON:  Study obtained earlier in the day  FINDINGS: There is a new central catheter with the tip in the superior vena cava just beyond the junction with the left innominate vein. Endotracheal tube tip is 4.3 cm above the carina. Nasogastric tube tip and side port are below the diaphragm. No pneumothorax. There is elevation the right hemidiaphragm. There is trace edema. There is new focal atelectasis in the right lower lobe. Heart is enlarged with pulmonary vascularity within normal limits.  IMPRESSION: New central catheter present with tip in the superior vena cava just beyond the junction with the left innominate vein. No pneumothorax. Slight edema. New atelectasis right lower lobe. Persistent elevation right hemidiaphragm. No change in cardiac silhouette.   Electronically Signed    By: Bretta Bang III M.D.   On: 17-Jul-2014 20:50   Dg Chest Port 1 View  07/17/2014   CLINICAL DATA:  Status post cardiac arrest  EXAM: PORTABLE CHEST - 1 VIEW  COMPARISON:  None.  FINDINGS: Endotracheal tube tip is 4.4 cm above the carina. Nasogastric tube tip and side port are below the diaphragm. No pneumothorax. There is mild interstitial edema, mildly asymmetric toward the right. No edema or consolidation. Heart appears mildly enlarged with pulmonary vascularity within normal limits. No adenopathy.  IMPRESSION: Tube positions as described without pneumothorax. Mild edema, slightly more on the right than on the left. Cardiac prominence. Suspect a degree of congestive heart failure.   Electronically Signed   By: Bretta Bang III M.D.   On: 2014/07/17 20:08   ASSESSMENT / PLAN:  PULMONARY OETT 6/28 >>> A: Acute mixed respiratory failure secondary to cardiac arrest ?PE P:   Extubate to RA, withdraw.  CARDIOVASCULAR CVL [facility] 6/28 >>> A:  Cardiogenic shock s/p arrest H/o HTN P:  D/C tele. Withdraw.  RENAL A:   AKI High AG metabolic acidosis  P:   D/C further blood draws.  GASTROINTESTINAL A:   No acute issues P:   D/C NGT.  HEMATOLOGIC A:   No acute issues P:  D/C further blood draws.  INFECTIOUS A:   No obvious signs/source of infection P:   D/C abx.  ENDOCRINE A:   Hyperglycemia with uncertain DM history P:   D/C CBGs. D/C ISS.  NEUROLOGIC A:   Acute anoxic encephalopathy Bipolar disorder P:   Morphine for comfort.  FAMILY  - Updates: Spoke with family extensively.  They are insistent that unless patient is to go back to normal he would not want to be alive.  The CT from last night is consistent with brain edema due to anoxic brain injury, EEG has no cerebral activity, neuro exam only has a respiratory drive.  The family refused formal brain death testing or speaking with a neurologist.  Evidently the patient has made his wishes clear that  unless he is back to normal and functional he would not want to live this way.  Given the family's wishes they are clear that they would like to withdraw current medical care.  Will give morphine 10 mg IV x1 then extubate.  Do not anticipate patient to breath for long.  The patient is critically ill with multiple organ systems  failure and requires high complexity decision making for assessment and support, frequent evaluation and titration of therapies, application of advanced monitoring technologies and extensive interpretation of multiple databases.   Critical Care Time devoted to patient care services described in this note is  35  Minutes. This time reflects time of care of this signee Dr Koren Bound. This critical care time does not reflect procedure time, or teaching time or supervisory time of PA/NP/Med student/Med Resident etc but could involve care discussion time.  Alyson Reedy, M.D. [facility]monary/Critical Care Medicine. Pager: [phone]. After hours pager: [phone].  06/28/2014 10:58 AM

## 2014-07-12 NOTE — Progress Notes (Signed)
eLink Physician-Brief Progress Note Patient Name: Ross Watts DOB: February 26, 1965 MRN: [id number]   Date of Service  06/24/2014  HPI/Events of Note  Copious watery diarrhrea.   eICU Interventions  Will order: 1. Flexiseal  2. Stool for C. Difficile toxin. 3. Enteric precautions.     Intervention Category Minor Interventions: Routine modifications to care plan (e.g. PRN medications for pain, fever);Clinical assessment - ordering diagnostic tests  Lenell AntuSommer,[name] 06/29/2014, 12:59 AM

## 2014-07-12 DEATH — deceased

## 2014-07-23 NOTE — Discharge Summary (Addendum)
NAME:  Ross Watts, Ross               ACCOUNT NO.:  [id number]  MEDICAL RECORD NO.:  [id number]  LOCATION:  3M11C                        FACILITY:  [facility]  PHYSICIAN:  Felipa EvenerWesam Jake Luisalberto Beegle, MD  DATE OF BIRTH:  05-11-1965  DATE OF ADMISSION:  07/01/2014 DATE OF DISCHARGE:  09-15-14                              DISCHARGE SUMMARY   PRIMARY DIAGNOSIS/CAUSE OF DEATH:  Pulseless electrical activity cardiac arrest.  SECONDARY DIAGNOSES: 1. Cardiogenic shock. 2. Acute respiratory failure. 3. Acute renal failure. 4. High anion gap metabolic acidosis. 5. Hyperglycemia. 6. Acute anoxic brain injury. 7. Hypokalemia.  HOSPITAL COURSE:  The patient was a 49 year old male with past medical history significant for bipolar disorder who was presented to the hospital after being found down, was found to be PEA cardiac arrest with prolonged resuscitation effort of an hour long in the emergency department __________ the patient was admitted to the intensive care unit.  The neurologic exam was extremely poor.  I spoke with the family extensively.  They were insistent that unless the patient was to go back to normal, he would not have wanted to be alive.  The CT from night prior was consistent with severe brain edema due to anoxic brain injury. EEG showed no cerebral activity.  On neuro exam, the patient only had a respiratory drive.  The family declined formal brain death testing __________ made his wishes very clear that unless he was back to his normal self and function, he would not have wanted to be kept alive on life support.  Given the family's wishes, they were clear that they would like to withdraw our current medical care and that we will give the patient a single dose of morphine, then extubate.  After extubation by approximately 30 minutes, the patient had another cardiac arrest and given code status at that point and on discussion with family from prior, no further resuscitative efforts  were done.  The patient was declared dead.  The family notified this.     Felipa EvenerWesam Jake Sire Poet, MD     WJY/MEDQ  D:  07/22/2014  T:  07/23/2014  Job:  [id number]

## 2016-07-11 IMAGING — CT CT ANGIO CHEST
2 of 11 series · 19 of 46 positions shown · IV contrast (Omni 300)
Comparison: Chest x-ray dated 07/09/2014

CLINICAL DATA: 49-year-old male with prolonged cardiac arrest.

EXAM:
CT ANGIOGRAPHY CHEST WITH CONTRAST
TECHNIQUE: Multidetector CT imaging of the chest was performed using the
standard protocol during bolus administration of intravenous
contrast. Multiplanar CT image reconstructions and MIPs were
obtained to evaluate the vascular anatomy.
CONTRAST:  100mL OMNIPAQUE IOHEXOL 350 MG/ML SOLN

[Series 9: thins · axial · 0.76mm/px · z∈[+772,+1017]mm · 16 of 279 slices shown]
[im 17/279  lung]
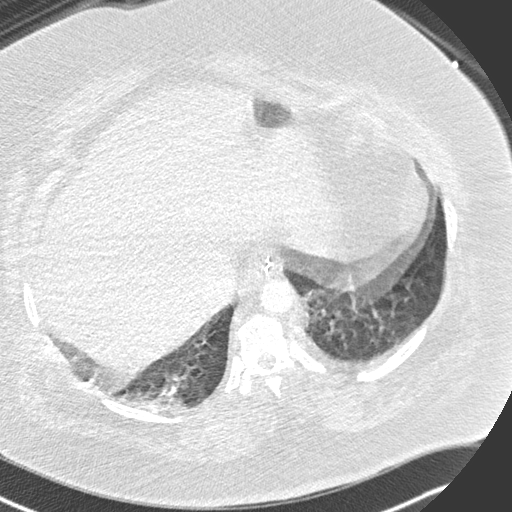
[im 33/279  soft-tissue]
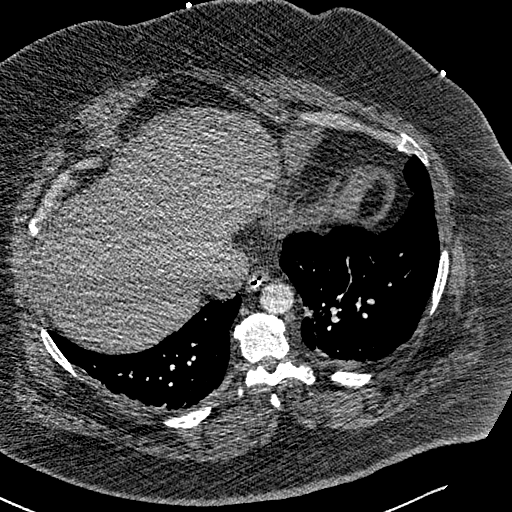
[im 50/279  lung]
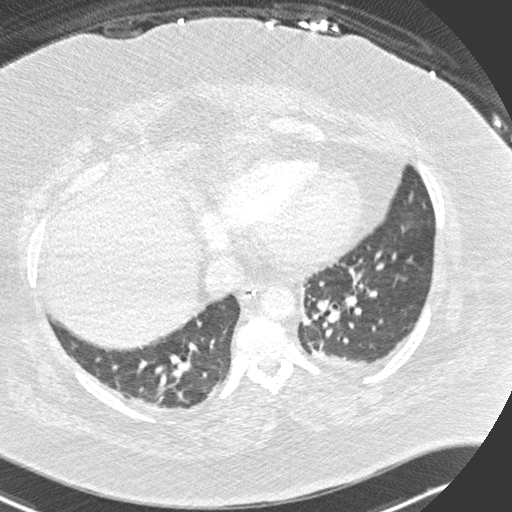
[im 66/279  soft-tissue]
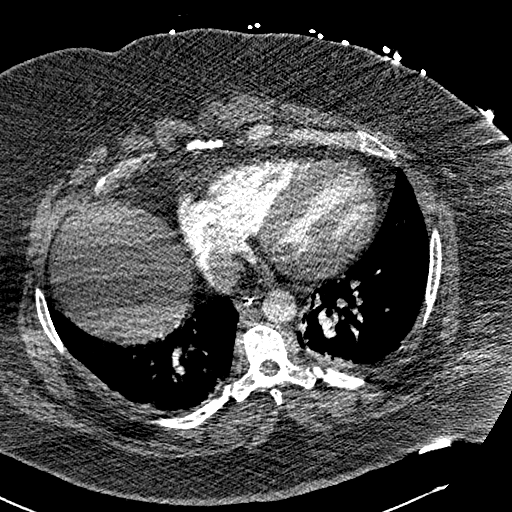
[im 82/279  lung]
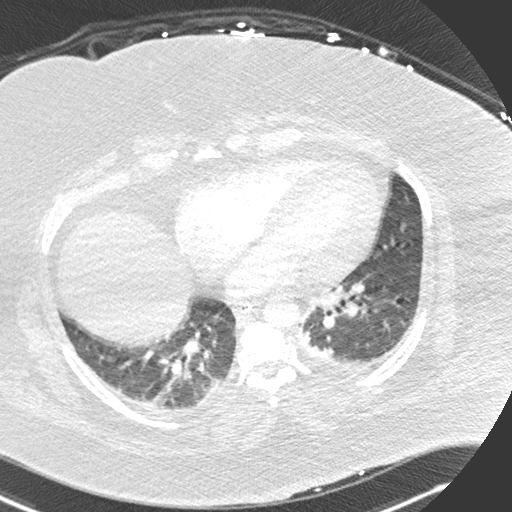
[im 99/279  soft-tissue]
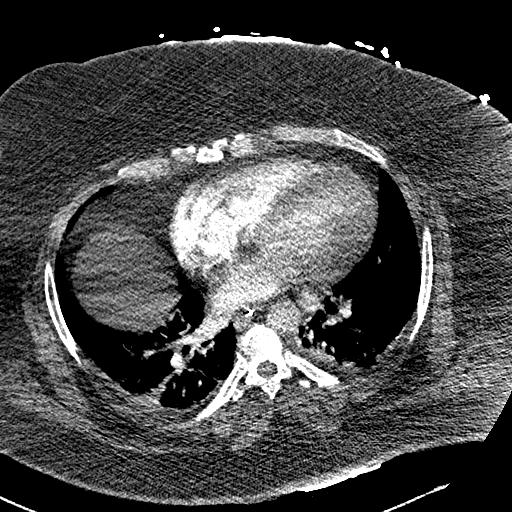
[im 115/279  lung]
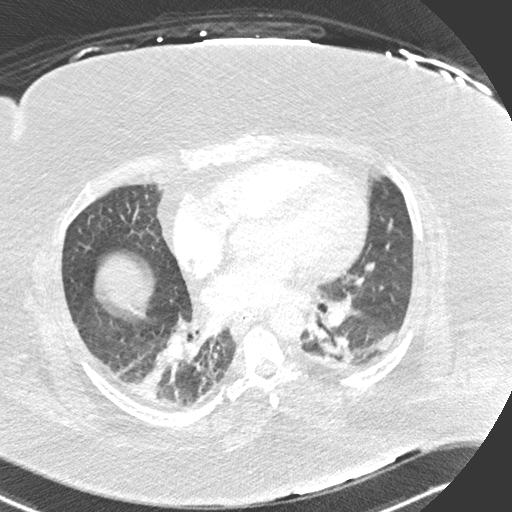
[im 131/279  soft-tissue]
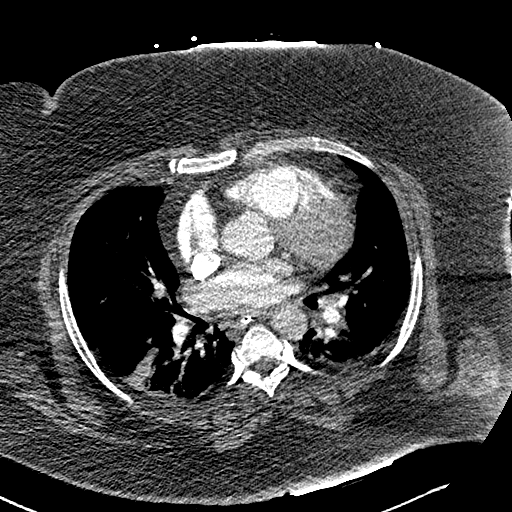
[im 148/279  lung]
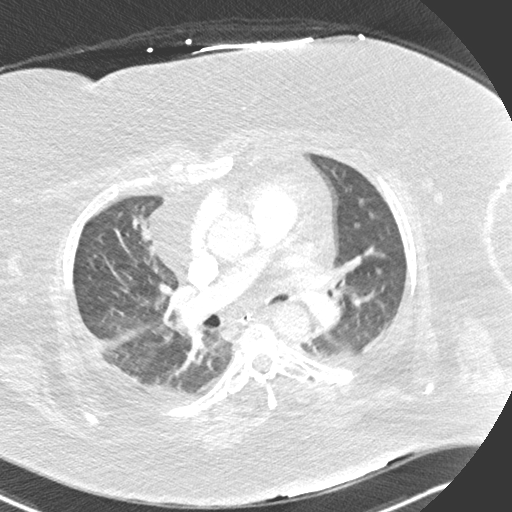
[im 164/279  soft-tissue]
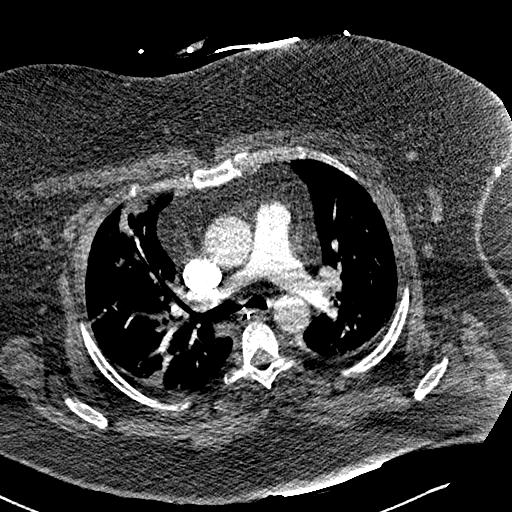
[im 180/279  lung]
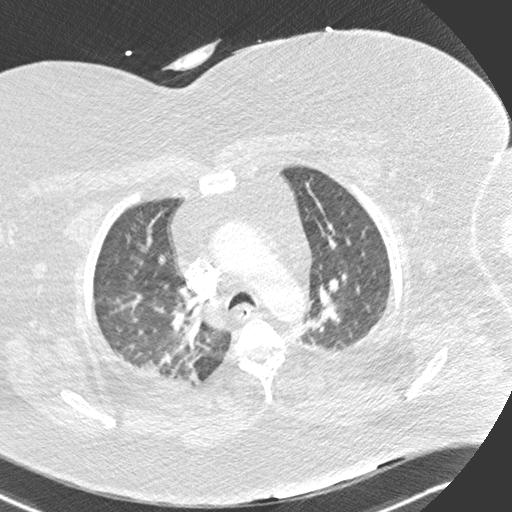
[im 197/279  soft-tissue]
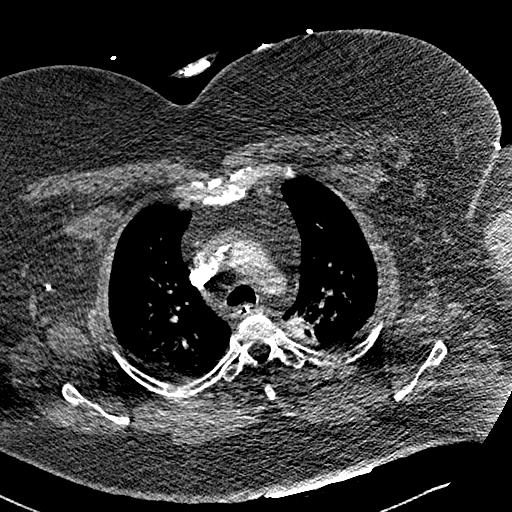
[im 213/279  lung]
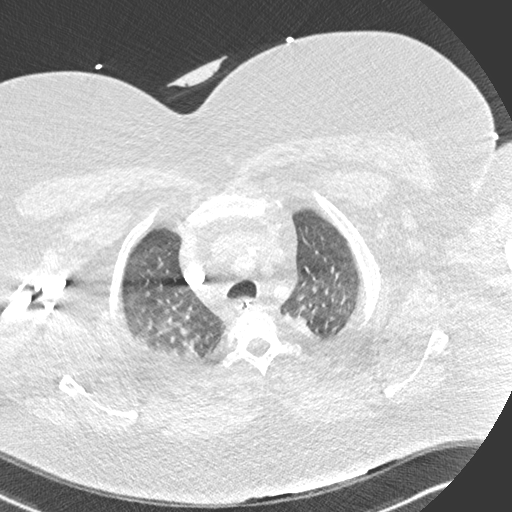
[im 229/279  soft-tissue]
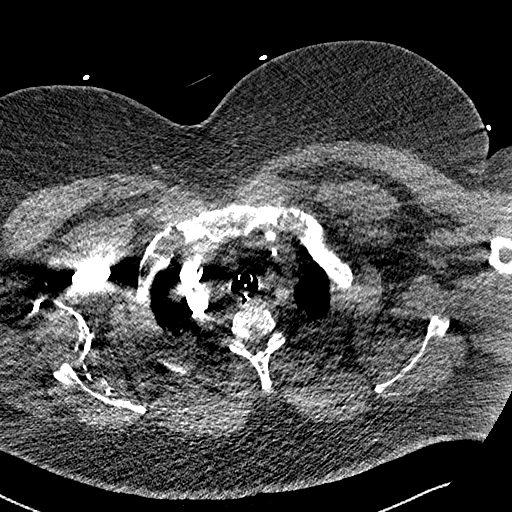
[im 246/279  lung]
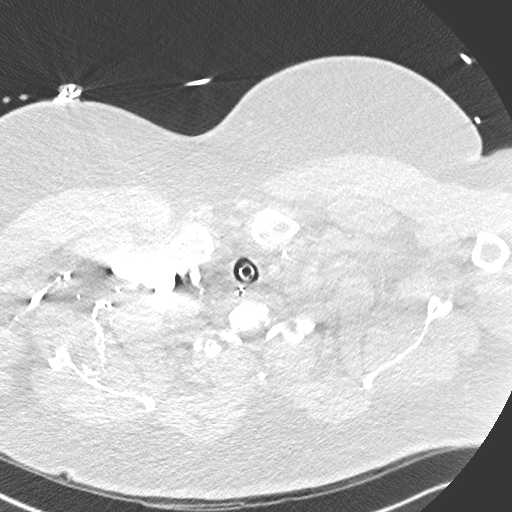
[im 262/279  soft-tissue]
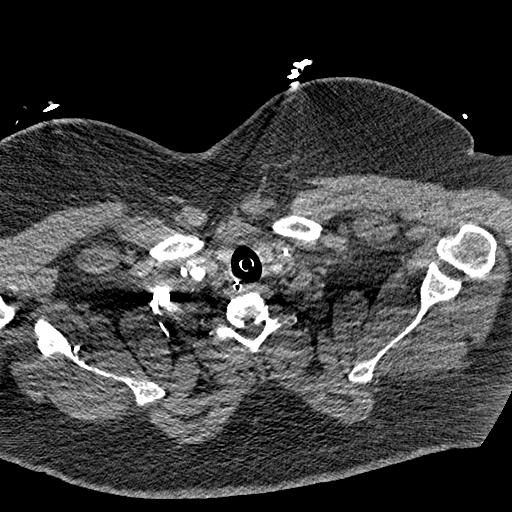

[Series 602: cor mpr · coronal · 0.76mm/px · 3 of 134 slices shown]
[im 34/134  soft-tissue]
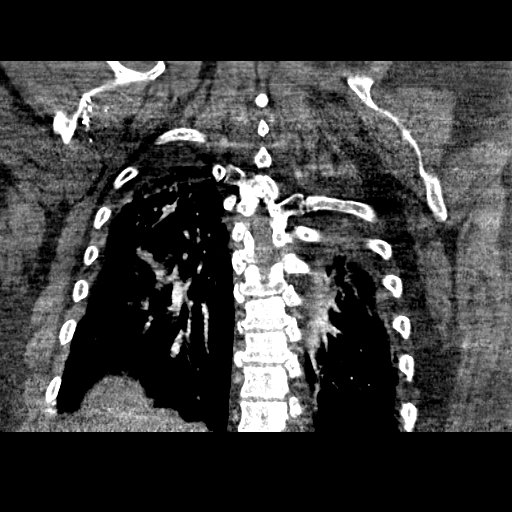
[im 67/134  soft-tissue]
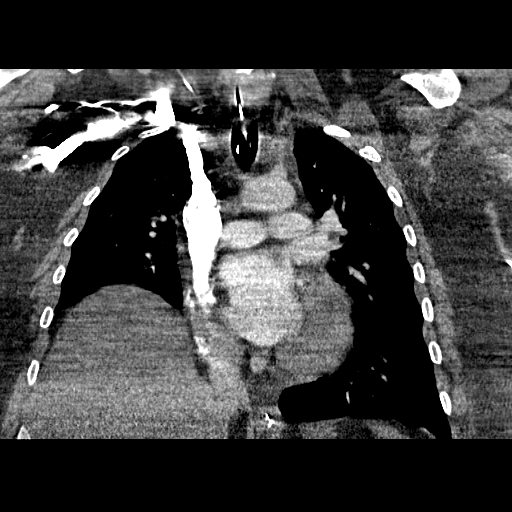
[im 100/134  soft-tissue]
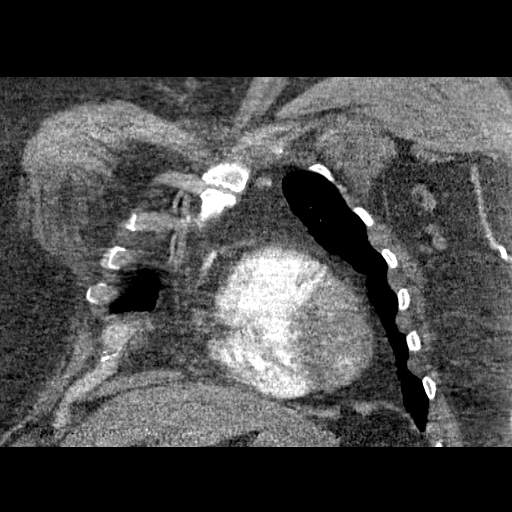

[19 of 46 positions shown; findings below may reference images not displayed]

FINDINGS: Evaluation of this exam is limited due to streak artifact caused by
patient's arms and body habitus.

Lungs, pleural spaces, and central airways: There are bilateral
linear atelectasis/ scarring. Bilateral lower lobe subsegmental
atelectatic changes noted. There is no airspace consolidation or
pleural effusion. No pneumothorax. An endotracheal tube noted with
tip above the carina. The central airways are patent.

Vasculature: Evaluation of the pulmonary arteries is limited due to
streak artifact. No central pulmonary artery embolus identified. No
thoracic aortic aneurysm or dissection. Left IJ central line with
tip in the upper SVC at the level of the left innominate vein.

Heart: No cardiomegaly. No pericardial effusion.

Mediastinum: Unremarkable. No mass. The An enteric tube is partially
visualized extending into the stomach.

Lymph nodes: No adenopathy .

Chest wall/ musculoskeletal: Unremarkable. No acute fracture.

Upper abdomen: Unremarkable as visualized. There is eventration of
the right hemidiaphragm.

Review of the MIP images confirms the above findings.
IMPRESSION: No acute intrathoracic pathology. No CT evidence of pulmonary
embolism.

## 2016-07-11 IMAGING — CT CT HEAD W/O CM
1 of 2 series · 16 of 30 positions shown, 20 images · non-contrast
Comparison: None.

CLINICAL DATA: Prolonged cardiac arrest.  Altered mental status.

EXAM:
CT HEAD WITHOUT CONTRAST
TECHNIQUE: Contiguous axial images were obtained from the base of the skull
through the vertex without intravenous contrast.

[Series 3: head 2.0 h70h · axial · 0.45mm/px · z∈[+1148,+1296]mm · 16 of 84 slices shown, 20 images]
[im 5/84  brain]
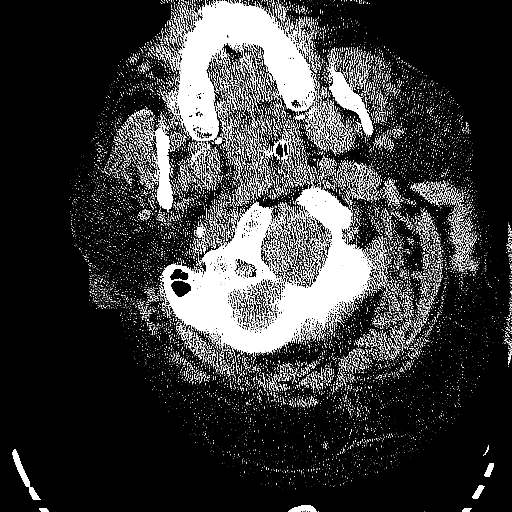
[im 5/84  bone]
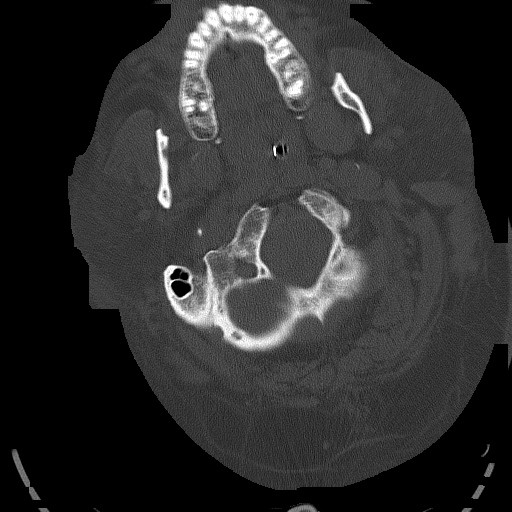
[im 9/84  brain]
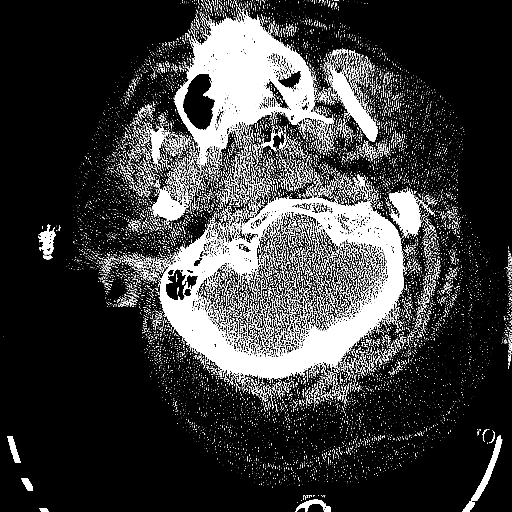
[im 13/84  brain]
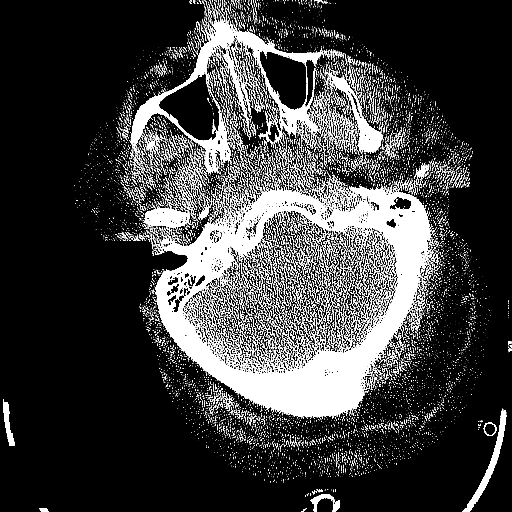
[im 21/84  brain]
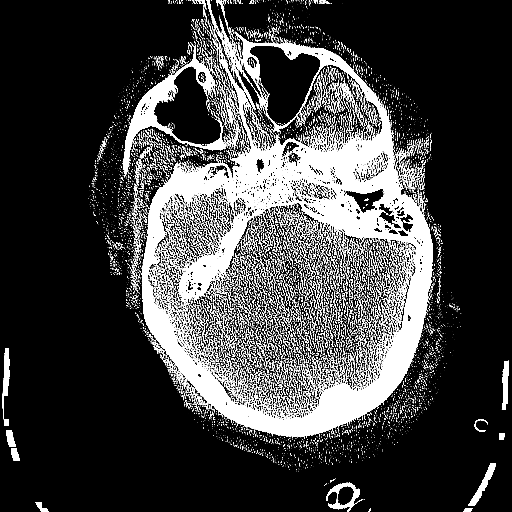
[im 25/84  brain]
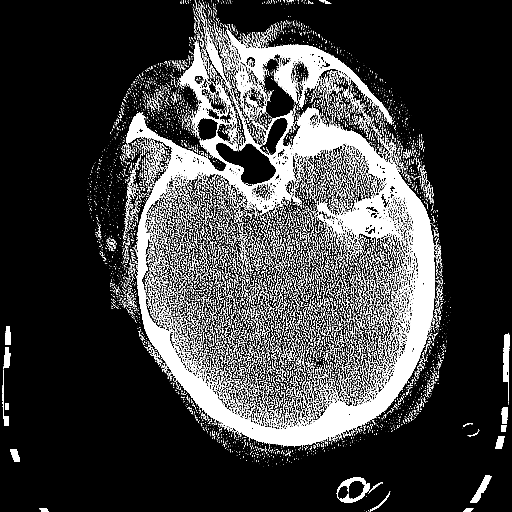
[im 25/84  bone]
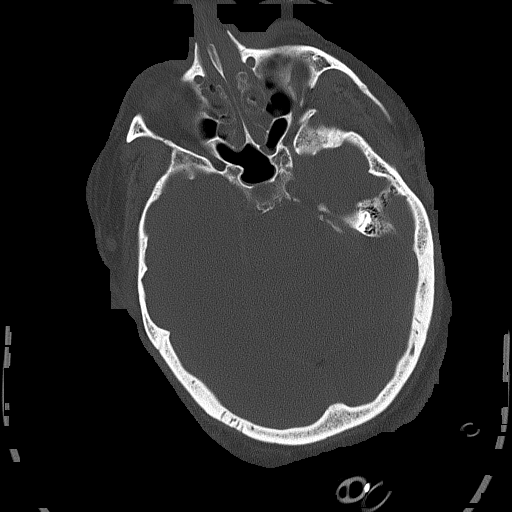
[im 30/84  brain]
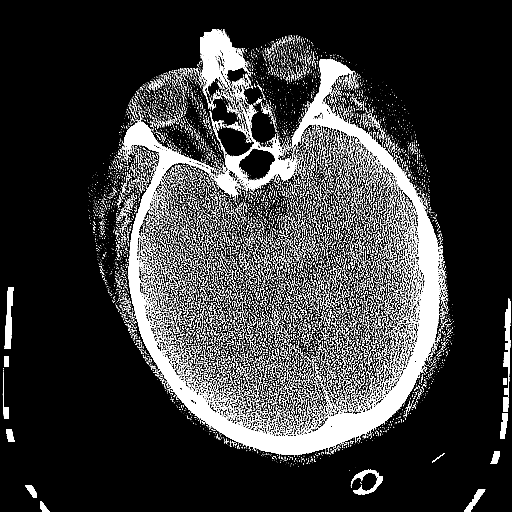
[im 34/84  brain]
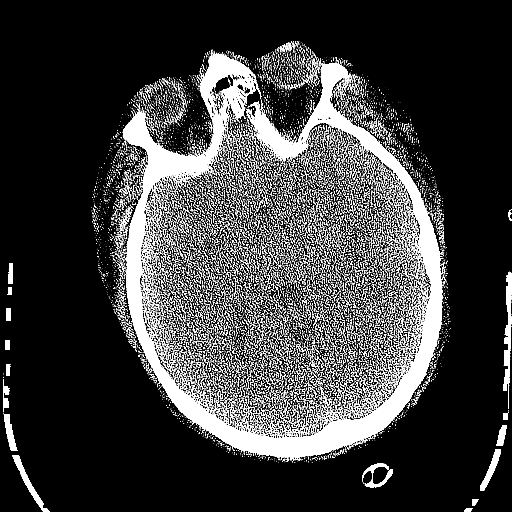
[im 38/84  brain]
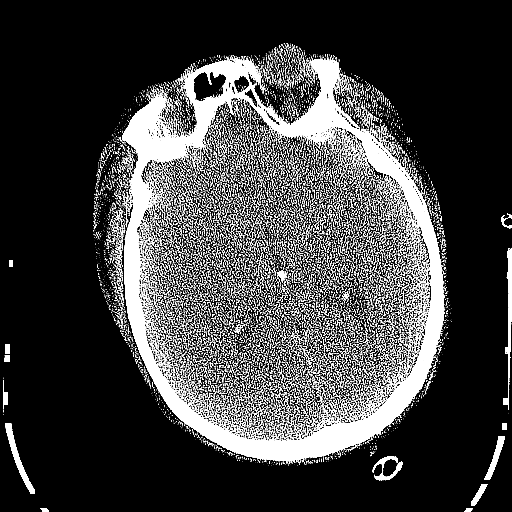
[im 46/84  brain]
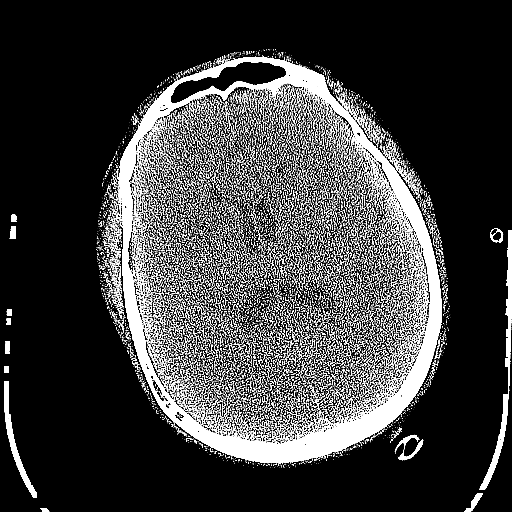
[im 46/84  bone]
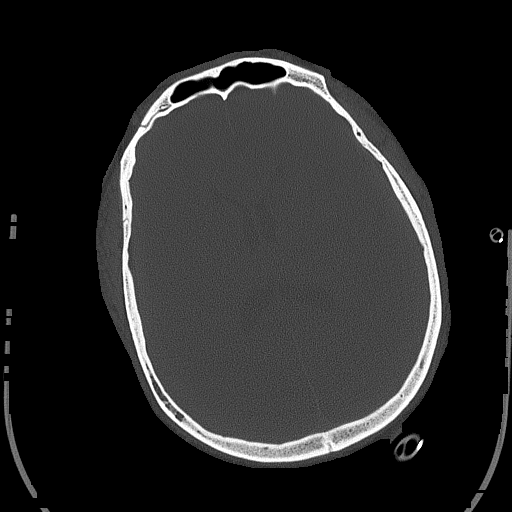
[im 50/84  brain]
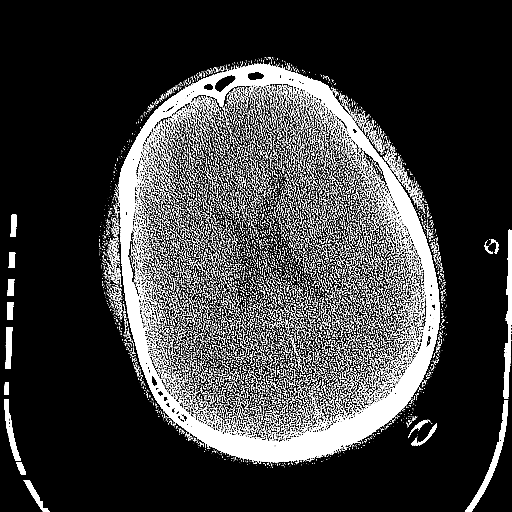
[im 54/84  brain]
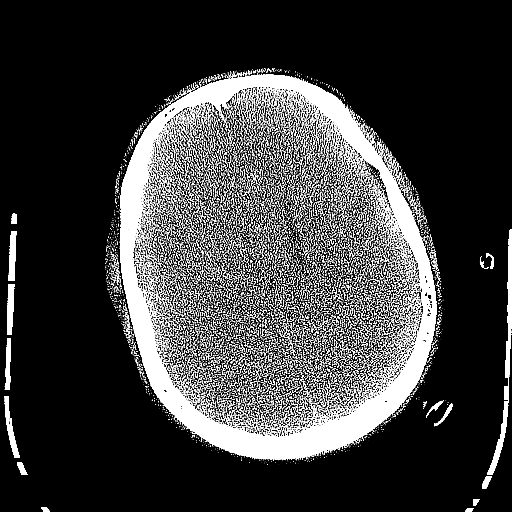
[im 59/84  brain]
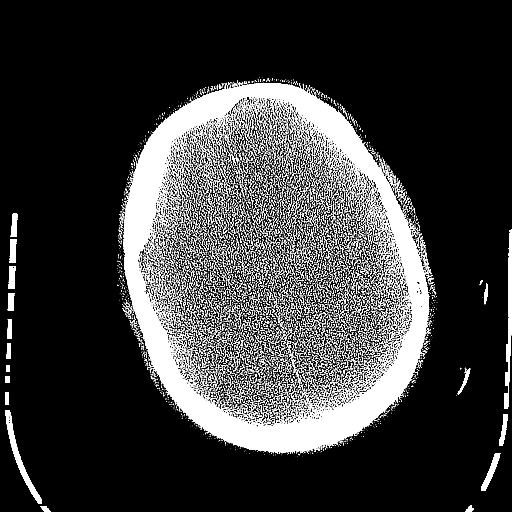
[im 63/84  brain]
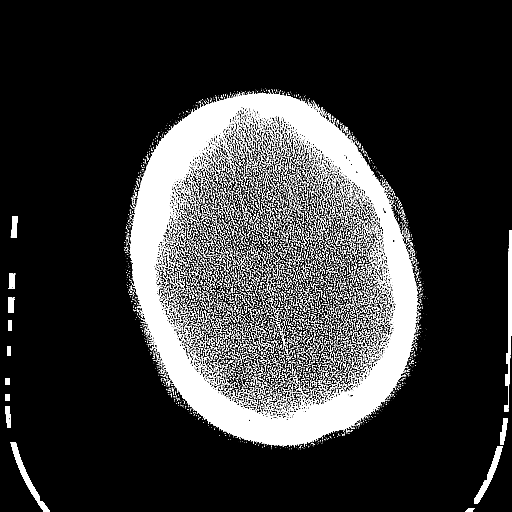
[im 63/84  bone]
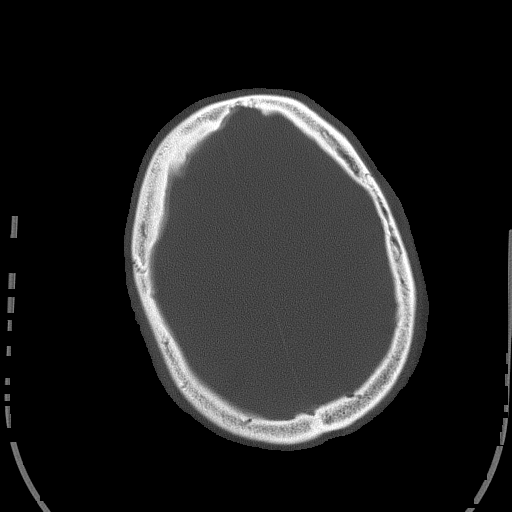
[im 71/84  brain]
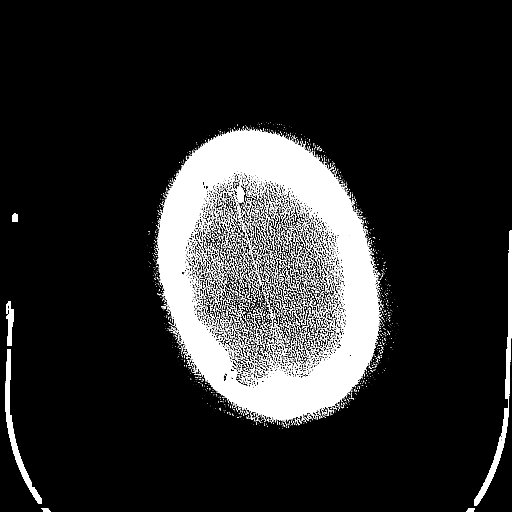
[im 75/84  brain]
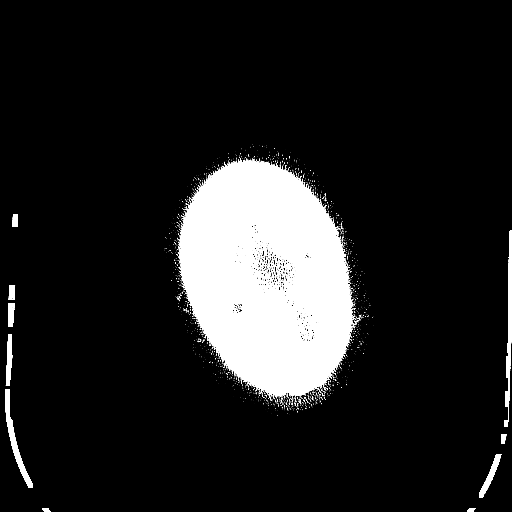
[im 79/84  brain]
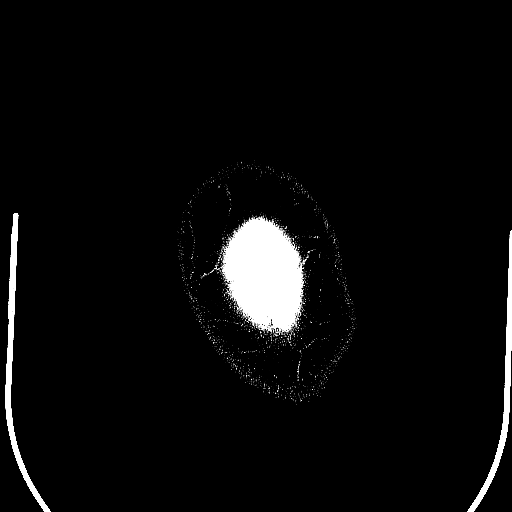

[16 of 30 positions shown; findings below may reference images not displayed]

FINDINGS: There is diffuse cerebral edema with obliteration of most of the
cortical sulci. Ventricles are slightly compressed.

There is no acute intracranial hemorrhage or infarction. There is
cerebellar edema. Fourth ventricle is compressed. Abnormal lucency
in the pons and medulla.

No acute osseous abnormality.
IMPRESSION: Diffuse cerebral edema including the brainstem.
